# Patient Record
Sex: Female | Born: 1970 | Race: White | Hispanic: No | Marital: Married | Smoking: Never smoker
Health system: Southern US, Community
[De-identification: ages and names within clinical notes are randomized; demographics above are authoritative.]

## PROBLEM LIST (undated history)

## (undated) DIAGNOSIS — E079 Disorder of thyroid, unspecified: Secondary | ICD-10-CM

## (undated) DIAGNOSIS — N83209 Unspecified ovarian cyst, unspecified side: Secondary | ICD-10-CM

## (undated) DIAGNOSIS — F419 Anxiety disorder, unspecified: Secondary | ICD-10-CM

## (undated) HISTORY — DX: Anxiety disorder, unspecified: F41.9

## (undated) HISTORY — PX: INTRAUTERINE DEVICE INSERTION: SHX323

## (undated) HISTORY — DX: Unspecified ovarian cyst, unspecified side: N83.209

## (undated) HISTORY — PX: BREAST BIOPSY: SHX20

## (undated) HISTORY — PX: NASAL SEPTUM SURGERY: SHX37

## (undated) HISTORY — DX: Disorder of thyroid, unspecified: E07.9

---

## 2001-02-07 ENCOUNTER — Other Ambulatory Visit: Admission: RE | Admit: 2001-02-07 | Discharge: 2001-02-07 | Payer: Self-pay | Admitting: Obstetrics and Gynecology

## 2002-08-14 ENCOUNTER — Ambulatory Visit (HOSPITAL_COMMUNITY): Admission: RE | Admit: 2002-08-14 | Discharge: 2002-08-14 | Payer: Self-pay

## 2002-08-16 ENCOUNTER — Encounter (INDEPENDENT_AMBULATORY_CARE_PROVIDER_SITE_OTHER): Payer: Self-pay | Admitting: Specialist

## 2002-08-16 ENCOUNTER — Ambulatory Visit (HOSPITAL_COMMUNITY): Admission: RE | Admit: 2002-08-16 | Discharge: 2002-08-16 | Payer: Self-pay

## 2002-12-12 ENCOUNTER — Ambulatory Visit (HOSPITAL_COMMUNITY): Admission: RE | Admit: 2002-12-12 | Discharge: 2002-12-12 | Payer: Self-pay

## 2002-12-24 ENCOUNTER — Ambulatory Visit (HOSPITAL_COMMUNITY): Admission: RE | Admit: 2002-12-24 | Discharge: 2002-12-24 | Payer: Self-pay

## 2003-01-16 ENCOUNTER — Ambulatory Visit (HOSPITAL_COMMUNITY): Admission: RE | Admit: 2003-01-16 | Discharge: 2003-01-16 | Payer: Self-pay

## 2003-01-17 ENCOUNTER — Encounter: Admission: RE | Admit: 2003-01-17 | Discharge: 2003-01-17 | Payer: Self-pay | Admitting: *Deleted

## 2003-01-24 ENCOUNTER — Observation Stay (HOSPITAL_COMMUNITY): Admission: RE | Admit: 2003-01-24 | Discharge: 2003-01-24 | Payer: Self-pay

## 2003-07-08 ENCOUNTER — Inpatient Hospital Stay (HOSPITAL_COMMUNITY): Admission: AD | Admit: 2003-07-08 | Discharge: 2003-07-08 | Payer: Self-pay

## 2003-07-25 ENCOUNTER — Ambulatory Visit (HOSPITAL_COMMUNITY): Admission: RE | Admit: 2003-07-25 | Discharge: 2003-07-25 | Payer: Self-pay

## 2003-07-28 ENCOUNTER — Inpatient Hospital Stay (HOSPITAL_COMMUNITY): Admission: RE | Admit: 2003-07-28 | Discharge: 2003-08-01 | Payer: Self-pay

## 2003-11-26 ENCOUNTER — Other Ambulatory Visit: Admission: RE | Admit: 2003-11-26 | Discharge: 2003-11-26 | Payer: Self-pay | Admitting: Obstetrics and Gynecology

## 2004-11-26 ENCOUNTER — Other Ambulatory Visit: Admission: RE | Admit: 2004-11-26 | Discharge: 2004-11-26 | Payer: Self-pay | Admitting: Obstetrics and Gynecology

## 2006-02-21 ENCOUNTER — Other Ambulatory Visit: Admission: RE | Admit: 2006-02-21 | Discharge: 2006-02-21 | Payer: Self-pay | Admitting: Obstetrics and Gynecology

## 2006-06-29 ENCOUNTER — Ambulatory Visit: Payer: Self-pay | Admitting: Family Medicine

## 2006-07-06 ENCOUNTER — Ambulatory Visit: Payer: Self-pay | Admitting: Family Medicine

## 2006-12-27 ENCOUNTER — Ambulatory Visit: Payer: Self-pay | Admitting: Family Medicine

## 2006-12-28 ENCOUNTER — Ambulatory Visit: Payer: Self-pay | Admitting: Family Medicine

## 2006-12-28 LAB — CONVERTED CEMR LAB
Eosinophils Absolute: 0.1 10*3/uL (ref 0.0–0.6)
Eosinophils Relative: 2.2 % (ref 0.0–5.0)
Lymphocytes Relative: 33.9 % (ref 12.0–46.0)
MCV: 93.8 fL (ref 78.0–100.0)
Monocytes Relative: 10.7 % (ref 3.0–11.0)
Neutro Abs: 2.2 10*3/uL (ref 1.4–7.7)
Platelets: 234 10*3/uL (ref 150–400)
T3, Free: 3.1 pg/mL (ref 2.3–4.2)

## 2007-01-16 ENCOUNTER — Ambulatory Visit: Payer: Self-pay | Admitting: Gastroenterology

## 2007-03-01 DIAGNOSIS — E039 Hypothyroidism, unspecified: Secondary | ICD-10-CM

## 2007-03-01 DIAGNOSIS — G43009 Migraine without aura, not intractable, without status migrainosus: Secondary | ICD-10-CM | POA: Insufficient documentation

## 2007-03-01 DIAGNOSIS — J342 Deviated nasal septum: Secondary | ICD-10-CM

## 2007-03-06 ENCOUNTER — Ambulatory Visit: Payer: Self-pay | Admitting: Gastroenterology

## 2007-03-20 ENCOUNTER — Ambulatory Visit: Payer: Self-pay | Admitting: Gastroenterology

## 2007-04-12 ENCOUNTER — Ambulatory Visit: Payer: Self-pay | Admitting: Family Medicine

## 2007-04-12 ENCOUNTER — Encounter: Payer: Self-pay | Admitting: Family Medicine

## 2007-04-12 ENCOUNTER — Encounter (INDEPENDENT_AMBULATORY_CARE_PROVIDER_SITE_OTHER): Payer: Self-pay | Admitting: Family Medicine

## 2007-04-13 LAB — CONVERTED CEMR LAB
Free T4: 0.9 ng/dL
T3, Free: 2.9 pg/mL
TSH: 2.69 u[IU]/mL

## 2007-04-16 ENCOUNTER — Telehealth (INDEPENDENT_AMBULATORY_CARE_PROVIDER_SITE_OTHER): Payer: Self-pay | Admitting: *Deleted

## 2007-05-29 ENCOUNTER — Ambulatory Visit: Payer: Self-pay | Admitting: Family Medicine

## 2007-05-30 ENCOUNTER — Telehealth (INDEPENDENT_AMBULATORY_CARE_PROVIDER_SITE_OTHER): Payer: Self-pay | Admitting: *Deleted

## 2007-07-12 ENCOUNTER — Encounter (INDEPENDENT_AMBULATORY_CARE_PROVIDER_SITE_OTHER): Payer: Self-pay | Admitting: Family Medicine

## 2007-07-12 ENCOUNTER — Ambulatory Visit: Payer: Self-pay | Admitting: Family Medicine

## 2007-07-12 ENCOUNTER — Other Ambulatory Visit: Admission: RE | Admit: 2007-07-12 | Discharge: 2007-07-12 | Payer: Self-pay | Admitting: Family Medicine

## 2007-07-17 ENCOUNTER — Telehealth (INDEPENDENT_AMBULATORY_CARE_PROVIDER_SITE_OTHER): Payer: Self-pay | Admitting: *Deleted

## 2007-07-17 LAB — CONVERTED CEMR LAB
Folate: 17.2 ng/mL
TSH: 2.35 microintl units/mL (ref 0.35–5.50)

## 2007-07-26 ENCOUNTER — Encounter: Admission: RE | Admit: 2007-07-26 | Discharge: 2007-07-26 | Payer: Self-pay | Admitting: Family Medicine

## 2007-07-31 ENCOUNTER — Encounter: Admission: RE | Admit: 2007-07-31 | Discharge: 2007-07-31 | Payer: Self-pay | Admitting: Family Medicine

## 2007-08-01 ENCOUNTER — Encounter (INDEPENDENT_AMBULATORY_CARE_PROVIDER_SITE_OTHER): Payer: Self-pay | Admitting: Diagnostic Radiology

## 2007-08-01 ENCOUNTER — Encounter (INDEPENDENT_AMBULATORY_CARE_PROVIDER_SITE_OTHER): Payer: Self-pay | Admitting: Family Medicine

## 2007-08-01 ENCOUNTER — Encounter: Admission: RE | Admit: 2007-08-01 | Discharge: 2007-08-01 | Payer: Self-pay | Admitting: Family Medicine

## 2007-09-12 ENCOUNTER — Ambulatory Visit: Payer: Self-pay | Admitting: Family Medicine

## 2007-09-13 ENCOUNTER — Telehealth (INDEPENDENT_AMBULATORY_CARE_PROVIDER_SITE_OTHER): Payer: Self-pay | Admitting: *Deleted

## 2007-11-16 ENCOUNTER — Telehealth (INDEPENDENT_AMBULATORY_CARE_PROVIDER_SITE_OTHER): Payer: Self-pay | Admitting: *Deleted

## 2008-01-29 ENCOUNTER — Encounter: Admission: RE | Admit: 2008-01-29 | Discharge: 2008-01-29 | Payer: Self-pay | Admitting: Family Medicine

## 2008-03-14 ENCOUNTER — Telehealth (INDEPENDENT_AMBULATORY_CARE_PROVIDER_SITE_OTHER): Payer: Self-pay | Admitting: *Deleted

## 2008-03-24 ENCOUNTER — Telehealth (INDEPENDENT_AMBULATORY_CARE_PROVIDER_SITE_OTHER): Payer: Self-pay | Admitting: *Deleted

## 2008-07-28 ENCOUNTER — Encounter: Admission: RE | Admit: 2008-07-28 | Discharge: 2008-07-28 | Payer: Self-pay | Admitting: Obstetrics and Gynecology

## 2008-12-29 ENCOUNTER — Ambulatory Visit: Payer: Self-pay | Admitting: Family Medicine

## 2008-12-29 DIAGNOSIS — M549 Dorsalgia, unspecified: Secondary | ICD-10-CM | POA: Insufficient documentation

## 2008-12-29 DIAGNOSIS — R5383 Other fatigue: Secondary | ICD-10-CM

## 2008-12-29 DIAGNOSIS — R5381 Other malaise: Secondary | ICD-10-CM

## 2009-01-05 ENCOUNTER — Telehealth (INDEPENDENT_AMBULATORY_CARE_PROVIDER_SITE_OTHER): Payer: Self-pay | Admitting: *Deleted

## 2009-01-05 LAB — CONVERTED CEMR LAB
Basophils Absolute: 0 10*3/uL (ref 0.0–0.1)
Basophils Relative: 0.9 % (ref 0.0–3.0)
Folate: 12.8 ng/mL
Free T4: 1.3 ng/dL (ref 0.6–1.6)
GFR calc Af Amer: 91 mL/min
GFR calc non Af Amer: 75 mL/min
Lymphocytes Relative: 31 % (ref 12.0–46.0)
MCHC: 33.8 g/dL (ref 30.0–36.0)
Neutrophils Relative %: 57.3 % (ref 43.0–77.0)
Platelets: 222 10*3/uL (ref 150–400)
Potassium: 4.2 meq/L (ref 3.5–5.1)
RBC: 4.42 M/uL (ref 3.87–5.11)
RDW: 12.1 % (ref 11.5–14.6)
Sodium: 138 meq/L (ref 135–145)
T3, Free: 3.1 pg/mL (ref 2.3–4.2)
TSH: 1.38 microintl units/mL (ref 0.35–5.50)
Vitamin B-12: 327 pg/mL (ref 211–911)

## 2009-06-11 ENCOUNTER — Other Ambulatory Visit: Admission: RE | Admit: 2009-06-11 | Discharge: 2009-06-11 | Payer: Self-pay | Admitting: Obstetrics and Gynecology

## 2009-10-30 ENCOUNTER — Telehealth: Payer: Self-pay | Admitting: Family Medicine

## 2009-11-25 ENCOUNTER — Telehealth: Payer: Self-pay | Admitting: Family Medicine

## 2009-12-07 ENCOUNTER — Ambulatory Visit: Payer: Self-pay | Admitting: Family Medicine

## 2009-12-07 DIAGNOSIS — R7309 Other abnormal glucose: Secondary | ICD-10-CM

## 2009-12-08 LAB — CONVERTED CEMR LAB
AST: 25 units/L (ref 0–37)
Albumin: 4.8 g/dL (ref 3.5–5.2)
Alkaline Phosphatase: 51 units/L (ref 39–117)
Bilirubin, Direct: 0 mg/dL (ref 0.0–0.3)
Calcium: 8.9 mg/dL (ref 8.4–10.5)
Cholesterol: 154 mg/dL (ref 0–200)
Creatinine, Ser: 0.6 mg/dL (ref 0.4–1.2)
Free T4: 1 ng/dL (ref 0.6–1.6)
Total Protein: 7.9 g/dL (ref 6.0–8.3)
Triglycerides: 62 mg/dL (ref 0.0–149.0)

## 2009-12-11 ENCOUNTER — Telehealth (INDEPENDENT_AMBULATORY_CARE_PROVIDER_SITE_OTHER): Payer: Self-pay | Admitting: *Deleted

## 2009-12-14 ENCOUNTER — Telehealth (INDEPENDENT_AMBULATORY_CARE_PROVIDER_SITE_OTHER): Payer: Self-pay | Admitting: *Deleted

## 2009-12-24 ENCOUNTER — Telehealth (INDEPENDENT_AMBULATORY_CARE_PROVIDER_SITE_OTHER): Payer: Self-pay | Admitting: *Deleted

## 2010-06-21 ENCOUNTER — Other Ambulatory Visit: Admission: RE | Admit: 2010-06-21 | Discharge: 2010-06-21 | Payer: Self-pay | Admitting: Obstetrics and Gynecology

## 2010-10-26 ENCOUNTER — Ambulatory Visit: Payer: Self-pay | Admitting: Family Medicine

## 2010-12-14 NOTE — Progress Notes (Signed)
Summary: refill  Phone Note Refill Request Message from:  Fax from Pharmacy on [address] fax [phone]  Refills Requested: Medication #1:  SYNTHROID 50 MCG  TABS 1 by mouth once daily. NEEDS LABWORK. Initial call taken by: Barb Merino,  December 14, 2009 2:30 PM    New/Updated Medications: SYNTHROID 50 MCG  TABS (LEVOTHYROXINE SODIUM) 1 by mouth once daily. Prescriptions: SYNTHROID 50 MCG  TABS (LEVOTHYROXINE SODIUM) 1 by mouth once daily.  #30 x 2   Entered by:   Army Fossa CMA   Authorized by:   Loreen Freud DO   Signed by:   Army Fossa CMA on 12/14/2009   Method used:   [facility]        [facility] [phone]* (retail)       [address]       [place], [place]  [address]       Ph: [phone]       Fax: [phone]   RxID:   [id number]

## 2010-12-14 NOTE — Assessment & Plan Note (Signed)
Summary: ROV/labs/drb   Vital Signs:  Patient profile:   40 year old female Height:      66.5 inches Weight:      163 pounds BMI:     26.01 Temp:     97.8 degrees F oral Pulse rate:   70 / minute Pulse rhythm:   regular BP sitting:   102 / 80  (left arm) Cuff size:   regular  Vitals Entered By: Army Fossa CMA (December 07, 2009 9:37 AM) CC: ROV, labs for thyroid.    History of Present Illness: Pt here for thyroid check.  Pt only complaints is she is always cold.   Pt still c/o back pain around menstrual cycle--- like a muscle spasm.  It resolves with end of cycle and she gets no relief with IB or aleve.  Current Medications (verified): 1)  Synthroid 50 Mcg  Tabs (Levothyroxine Sodium) .Marland Kitchen.. 1 By Mouth Once Daily. Needs Labwork. 2)  Camila 0.35 Mg Tabs (Norethindrone (Contraceptive)) .... As Directed. 3)  Flonase 50 Mcg/act Susp (Fluticasone Propionate) 4)  Skelaxin 800 Mg Tabs (Metaxalone) .Marland Kitchen.. 1 By Mouth Qid As Needed  Allergies (verified): No Known Drug Allergies  Past History:  Past medical, surgical, family and social histories (including risk factors) reviewed for relevance to current acute and chronic problems.  Past Medical History: Reviewed history from 12/29/2008 and no changes required. Hypothyroidism  Past Surgical History: Reviewed history from 07/12/2007 and no changes required. c/s deviated septum  Family History: Reviewed history and no changes required. MGM-- breast CA Family History Hypertension Family History High cholesterol MGF--DM II  Social History: Reviewed history from 07/12/2007 and no changes required. Married Never Smoked Alcohol use-yes Drug use-no Regular exercise-yes Occupation: Massage therapy  Review of Systems      See HPI  Physical Exam  General:  Well-developed,well-nourished,in no acute distress; alert,appropriate and cooperative throughout examination Neck:  No deformities, masses, or tenderness noted. Lungs:   Normal respiratory effort, chest expands symmetrically. Lungs are clear to auscultation, no crackles or wheezes. Heart:  normal rate and no murmur.   Psych:  Cognition and judgment appear intact. Alert and cooperative with normal attention span and concentration. No apparent delusions, illusions, hallucinations   Impression & Recommendations:  Problem # 1:  HYPOTHYROIDISM (ICD-244.9)  Her updated medication list for this problem includes:    Synthroid 50 Mcg Tabs (Levothyroxine sodium) .Marland Kitchen... 1 by mouth once daily. needs labwork.  Labs Reviewed: TSH: 1.38 (12/29/2008)     Orders: Venipuncture (16109) TLB-BMP (Basic Metabolic Panel-BMET) (80048-METABOL) TLB-Lipid Panel (80061-LIPID) TLB-Hepatic/Liver Function Pnl (80076-HEPATIC) TLB-TSH (Thyroid Stimulating Hormone) (84443-TSH) TLB-A1C / Hgb A1C (Glycohemoglobin) (83036-A1C) TLB-T3, Free (Triiodothyronine) (84481-T3FREE) TLB-T4 (Thyrox), Free [phone])  Problem # 2:  BACK PAIN (ICD-724.5)  Pt states the pain seems to be around period and its  muscle spasms--that resolve when cycle done---aleve and IB give no relief Her updated medication list for this problem includes:    Skelaxin 800 Mg Tabs (Metaxalone) .Marland Kitchen... 1 by mouth qid as needed  Discussed use of moist heat or ice, modified activities, medications, and stretching/strengthening exercises. Back care instructions given. To be seen in 2 weeks if no improvement; sooner if worsening of symptoms.   Problem # 3:  HYPERGLYCEMIA (ICD-790.29) last labs not fasting ---repeat today Orders: Venipuncture (19147) TLB-BMP (Basic Metabolic Panel-BMET) (80048-METABOL) TLB-Lipid Panel (80061-LIPID) TLB-Hepatic/Liver Function Pnl (80076-HEPATIC) TLB-TSH (Thyroid Stimulating Hormone) (84443-TSH) TLB-A1C / Hgb A1C (Glycohemoglobin) (83036-A1C) TLB-T3, Free (Triiodothyronine) (84481-T3FREE) TLB-T4 (Thyrox), Free [phone])  Complete Medication List:  1)  Synthroid 50 Mcg Tabs  (Levothyroxine sodium) .Marland Kitchen.. 1 by mouth once daily. needs labwork. 2)  Camila 0.35 Mg Tabs (Norethindrone (contraceptive)) .... As directed. 3)  Flonase 50 Mcg/act Susp (Fluticasone propionate) 4)  Skelaxin 800 Mg Tabs (Metaxalone) .Marland Kitchen.. 1 by mouth qid as needed Prescriptions: SKELAXIN 800 MG TABS (METAXALONE) 1 by mouth qid as needed  #30 x 0   Entered and Authorized by:   Loreen Freud DO   Signed by:   Loreen Freud DO on 12/07/2009   Method used:   Electronically to        [facility] [phone]* (retail)       [address]       [place], [place]  [address]       Ph: [phone]       Fax: [phone]   RxID:   [id number]

## 2010-12-14 NOTE — Progress Notes (Signed)
Summary: Labs   Phone Note Call from Patient   Summary of Call: Pt called and stated she needs labs done- she would like a ANA, Vitamin b and her TSH- is there anything else you would like checked?  Initial call taken by: Army Fossa CMA,  November 25, 2009 9:49 AM  Follow-up for Phone Call        She was seen last Feb---she is also due for ov--- I can't just order ANA, b12 without Dx codes--- maybe she should come in to discuss it---come in fasting---then I can send her to the lab when we are done.   Follow-up by: Loreen Freud DO,  November 25, 2009 10:00 AM  Additional Follow-up for Phone Call Additional follow up Details #1::        appt made. Army Fossa CMA  November 25, 2009 10:08 AM

## 2010-12-14 NOTE — Progress Notes (Signed)
Summary: REFILL  Phone Note Refill Request Message from:  [facility] from [facility] on [facility] FAX [phone]  Refills Requested: Medication #1:  SYNTHROID 50 MCG  TABS 1 by mouth once daily. Initial call taken by: Barb Merino,  December 24, 2009 8:23 AM  Follow-up for Phone Call        spoke with pharmacy- she has refills on file. Army Fossa CMA  December 24, 2009 10:01 AM

## 2010-12-14 NOTE — Progress Notes (Signed)
Summary: lab results  Phone Note Call from Patient   Caller: Patient Summary of Call: pt left VM on danielle phone stating that she would like the results of her labs. called pt back informed pt labs normal and letter mailed...............Marland KitchenFelecia Deloach CMA  December 11, 2009 10:26 AM

## 2011-02-09 ENCOUNTER — Telehealth: Payer: Self-pay | Admitting: Family Medicine

## 2011-02-09 MED ORDER — LEVOTHYROXINE SODIUM 50 MCG PO TABS: 50.0000 ug | ORAL_TABLET | Freq: Every day | ORAL | Status: DC

## 2011-02-09 NOTE — Telephone Encounter (Signed)
Pt is requesting refill for Synthroid.[facility].

## 2011-03-02 ENCOUNTER — Other Ambulatory Visit: Payer: Self-pay | Admitting: Obstetrics and Gynecology

## 2011-03-02 DIAGNOSIS — Z1231 Encounter for screening mammogram for malignant neoplasm of breast: Secondary | ICD-10-CM

## 2011-03-28 ENCOUNTER — Telehealth: Payer: Self-pay | Admitting: Family Medicine

## 2011-03-28 NOTE — Telephone Encounter (Signed)
No documentation from [facility]----Mssg left for patient to call back      KP

## 2011-03-28 NOTE — Telephone Encounter (Signed)
Patient called because it is time for her yearly refill for her Synthroid 16 medication---says she had CPX in January at [facility] [address]

## 2011-03-29 MED ORDER — LEVOTHYROXINE SODIUM 50 MCG PO TABS: 50.0000 ug | ORAL_TABLET | Freq: Every day | ORAL | Status: DC

## 2011-03-29 NOTE — Telephone Encounter (Signed)
Pt return call will bring in labs and OV correspondence performed at [facility] for Dr Laury Axon to review. Pt notes that she has had a lot of test done this year and would like to avoid any further blood work if she can.

## 2011-03-29 NOTE — Telephone Encounter (Signed)
Rx faxed----results pending     KP

## 2011-03-30 ENCOUNTER — Other Ambulatory Visit: Payer: Self-pay | Admitting: Family Medicine

## 2011-03-30 NOTE — Telephone Encounter (Signed)
Patient has appt for CPX on 04/26/2011 at 2:00--she needs refill for Synthroid 50 now that she has appt--  I see refill in computer, but phone note states:   Rx faxed----results pending KP (entered 5/15)---does that mean it was sent to pharmacy????

## 2011-03-31 NOTE — Telephone Encounter (Signed)
Yes it was faxed on 03/29/11.

## 2011-04-01 MED ORDER — LEVOTHYROXINE SODIUM 50 MCG PO TABS: 50.0000 ug | ORAL_TABLET | Freq: Every day | ORAL | Status: DC

## 2011-04-01 NOTE — Assessment & Plan Note (Signed)
[facility]                        [place] OFFICE NOTE   ZHOEY, BLACKSTOCK                      MRN:          [id number]  DATE:12/27/2006                            DOB:          05/22/1971    REASON FOR VISIT:  Rectal bleeding 1 week ago and stress.  Ms. Camera is  a 40 year old female who reports that over a week ago she came down with  a stomach virus.  It was associated with upset stomach and diarrhea.  Symptoms had improved, but 1 day prior to having rectal bleeding, she  noted that her stool was dark.  The subsequent day, she noted blood in  the commode.  It only occurred once and she has not had a recurrence in  1 week.  It was painless.  The patient states that she has never had  this happen before.  Of note, the patient does state that her  grandfather had a history of colon cancer, and unfortunately she does  not know mother's status because mother refuses to have a colonoscopy.  She reports that her father has a history of colon polyps and gets  surveillance yearly.   Ms. Kuroda also reports that she has been under a lot of stress.  She  states that she worries a lot and feels anxious.  It lasts for a couple  days and then resolves.  It never stays for an extended period of time.  Never the less, it is very frustrating for the patient.  As per her own  words, she has no time for this.  She denies any homicidal or suicidal  ideations.  She denies any depression.   PAST MEDICAL HISTORY:  1. History of hypothyroidism.  2. Migraines.  3. Basal cell carcinoma.   MEDICATIONS:  Over-the-counter herbs.   ALLERGIES:  No known drug allergies.   OBJECTIVE:  Weight 156.4, temperature 98.1, pulse 72, blood pressure  128/80.  GENERAL:  We have a pleasant female in no acute distress.  Answers  questions appropriately.  ABDOMEN:  Unremarkable.  RECTAL:  Significant for no obvious external hemorrhoids.  Normal tone.  No palpable  masses.  Negative Hemoccult.  Anoscope significant for no  obvious lesions.  No hemorrhoids noted.  PSYCH:  Significant for normal mood.  Speech regular rate and rhythm.  The patient is well-dressed and groomed, answers questions  appropriately.   IMPRESSION:  1. One episode of rectal bleeding in the presence of an acute      enteritis.  Never the less, given the family history of colon      cancer and colon polyps, further evaluation is warranted.  2. Acute stress reaction.   PLAN:  1. I will obtain a CBC to rule out anemia.  Subsequently, will refer      the patient to      gastroenterology for further recommendations.  2. In regard to the acute stress reaction, will refer to Berniece Andreas      for further therapy.  The patient expressed understanding.     Leanne Chang, M.D.  Electronically Signed  LA/MedQ  DD: 12/27/2006  DT: 12/27/2006  Job #: [id number]

## 2011-04-01 NOTE — Op Note (Signed)
   NAME:  Gina Pineda, Gina Pineda                         ACCOUNT NO.:  [id number]   MEDICAL RECORD NO.:  [id number]                   PATIENT TYPE:  MAT   LOCATION:  MATC                                 [facility]ILITY[facility]  [facility]   PHYSICIAN:  Ronda Fairly. Galen Daft, M.D.              DATE OF BIRTH:  September 16, 1971   DATE OF PROCEDURE:  07/08/2003  DATE OF DISCHARGE:                                 OPERATIVE REPORT   PREOPERATIVE DIAGNOSIS:  Cerclage removal.   POSTOPERATIVE DIAGNOSIS:  Cerclage removal.   PROCEDURE:  Cerclage removal.   ANESTHESIA:  None.   COMPLICATIONS:  None.   ESTIMATED BLOOD LOSS:  Less than 5 mL.   SURGEON:  Ronda Fairly. Galen Daft, M.D.   The patient was brought to the maternity admissions unit for a cerclage  removal.  We obtained informed verbal consent.  The cerclage was removed  without difficulty.  It was a singe suture which was grasped up near the  knot and underneath the knot was cut with a long scissors.  It was removed  without difficulty.  There was no active bleeding afterward.  There was  approximately 5 mL of blood total and this stopped without any difficult.  The group B strep culture was obtained prior to the patient leaving.  Sterile technique was utilized and sterile gloves.  The cervix was 1 cm  dilated at the termination of the procedure.  Otherwise unremarkable.  Membranes remained intact.                                                Ronda Fairly. Galen Daft, M.D.    NJT/MEDQ  D:  07/08/2003  T:  07/08/2003  Job:  [id number]

## 2011-04-01 NOTE — Op Note (Signed)
NAME:  [name], [name]                         ACCOUNT NO.:  [id number]   MEDICAL RECORD NO.:  [id number]                   PATIENT TYPE:  INP   LOCATION:  9158                                 [facility]   PHYSICIAN:  Ronda Fairly. Galen Daft, M.D.              DATE OF BIRTH:  06-08-1971   DATE OF PROCEDURE:  07/28/2003  DATE OF DISCHARGE:                                 OPERATIVE REPORT   PREOPERATIVE DIAGNOSIS:  Breech presentation.   POSTOPERATIVE DIAGNOSIS:  Breech presentation.   PROCEDURE:  External cephalic version, unsuccessful.   SURGEON:  Ronda Fairly. Galen Daft, M.D.   ANESTHESIA:  None.   COMPLICATIONS:  None.   ESTIMATED BLOOD LOSS:  None.   PROCEDURE NOTE:  The patient was identified as Gina Pineda.  We went over  the informed consent of all risks, benefits, and alternatives of the  procedure.  The discomfort of the procedure was explicitly explained and the  patient agreed to the procedure.  The plan was for external cephalic  version, followed by induction of labor.  She is 38 weeks, 4 days.  If this  was not successful, the back-up plan was for cesarean delivery that day.  This was to reduce the risk of cord accident, and it was felt that she was  within a margin of error of 39 weeks' gestation or elective delivery date.  The patient was offered all other options, including expectant management,  primary cesarean section at 39 weeks, version later on in the week, and  vaginal breech also was explained but refuted by the patient.  The procedure  went as follows:  She received intravenous administration of lactated  Ringer's and dosage of 0.25 mg intravenous terbutaline administered by me.  The baby was confirmed to be in breech presentation with normal fetal heart  tones.  External cephalic version was carried out in a forward fashion after  identification of all anatomy and position, and the head was flexed well and  the placenta was lateral, and the baby was in a  frank breech presentation.  The forward roll was unsuccessful.  It was attempted a second time.  The  patient did very well tolerating it, although it was quite uncomfortable.  A  third chance was offered with the possibility of a backward flip, and the  patient declined this.  Therefore, cesarean section was the plan for back-  up.  The patient tolerated the procedure well.  There was approximately a  one-minute deceleration associated with the procedure, which resolved  spontaneously without the need for any medication or any other measures.  The fetal heart rate was reassuring after the procedure.  Ronda Fairly. Galen Daft, M.D.    NJT/MEDQ  D:  07/28/2003  T:  07/28/2003  Job:  [id number]

## 2011-04-01 NOTE — H&P (Signed)
   NAME:  Gina Pineda, Gina Pineda                         ACCOUNT NO.:  [id number]   MEDICAL RECORD NO.:  [id number]                   PATIENT TYPE:  MAT   LOCATION:  MATC                                 [facility]:  [facility]   PHYSICIAN:  Ronda Fairly. Galen Daft, M.D.              DATE OF BIRTH:  17-Apr-1971   DATE OF ADMISSION:  07/08/2003  DATE OF DISCHARGE:                                HISTORY & PHYSICAL   CHIEF COMPLAINT:  Cervical cerclage, for removal.   HISTORY:  The patient is 36 weeks, G3 P1, who is presenting for a cervical  cerclage removal.  She had a cerclage because of her prior cervical  incompetence problem with her previous delivery; she did deliver at term  ultimately.  The patient had no prenatal problems.  Her cerclage was placed  at around the 12th week of pregnancy and it was a single suture McDonald-  type cerclage.   PROCEDURE:  The patient was placed in a modified dorsal lithotomy position.  Speculum was utilized - sterile speculum and sterile gloves.  The stitch was  grasped and removed without difficulty.  Group B strep culture was obtained.  The procedure was uncomplicated.   PHYSICAL EXAMINATION:  GENERAL:  The patient was alert and oriented.  VITAL SIGNS:  Stable.  The fetus was reactive and reassuring.  ABDOMEN:  The upper abdomen unremarkable, gravid, vertex presentation.  CARDIAC:  Regular rate and rhythm.  RESPIRATORY:  Clear.  The remainder of examination unremarkable.  PELVIC:  Shows the suture in place, which was removed as described above.   ASSESSMENT:  The patient here for suture removal, which was completed.   PLAN:  Discharge home with instructions to follow-up for labor precautions;  otherwise, follow up later this week for routine obstetrical care.                                               Ronda Fairly. Galen Daft, M.D.    NJT/MEDQ  D:  07/08/2003  T:  07/08/2003  Job:  [id number]

## 2011-04-01 NOTE — Discharge Summary (Signed)
   NAME:  Gina Pineda, Gina Pineda                         ACCOUNT NO.:  [id number]   MEDICAL RECORD NO.:  [id number]                   PATIENT TYPE:  INP   LOCATION:  9131                                 [facility]   PHYSICIAN:  Charles A. Sydnee Cabal, MD            DATE OF BIRTH:  [birth year]   DATE OF ADMISSION:  07/28/2003  DATE OF DISCHARGE:  08/01/2003                                 DISCHARGE SUMMARY   PRIMARY DISCHARGE DIAGNOSES:  1. Intrauterine pregnancy at term.  2. Breech.   PROCEDURES:  1. Failed external version.  2. Primary low-transverse cesarean section.   DISPOSITION:  1. The patient was discharged home to follow up in the office in 7-10 days.  2. She was given a prescription for Percocet #40, 1-2 p.o. q.4h. p.r.n., 5     mg/325 mg dose.  3. She was given convalescent instructions to rest at home.  Notify of any     increased pain, erythema or drainage about the incision, heavy vaginal     bleeding, temperature greater than 101 degrees.  Convalescent     instructions were given additionally for pelvic rest and for abstaining     from driving a car for 10 days.   LABORATORY:  Hemoglobin was 9.2, on 07/30/2003.   HOSPITAL COURSE:  The patient was admitted, underwent version attempt,  version failed and the patient underwent primary low-transverse cesarean  section.  The patient had a vigorous baby girl.  Estimated blood loss at the  time of surgery was 1,200 cc.  Postoperatively the patient had normal  lochia.  No heavy bleeding.  Hemoglobin returned as noted above.  The  patient voided without difficulty after Foley catheter was discontinued.  Post-op day #1, she was advanced to a general diet.  Post-op day #2,  spontaneous return of flatus.  She continued to do well, had no febrile  morbidity and was discharged home, on post-op day #4, with followup as noted  above.                                               Charles A. Sydnee Cabal, MD    CAD/MEDQ  D:   08/19/2003  T:  08/20/2003  Job:  [id number]

## 2011-04-01 NOTE — Op Note (Signed)
   NAME:  Gina Pineda, Gina Pineda                         ACCOUNT NO.:  [id number]   MEDICAL RECORD NO.:  [id number]                   PATIENT TYPE:  AMB   LOCATION:  SDC                                  [facility]  [facility]   PHYSICIAN:  Ronda Fairly. Galen Daft, M.D.              DATE OF BIRTH:  04/20/1971   DATE OF PROCEDURE:  08/16/2002  DATE OF DISCHARGE:  08/16/2002                                 OPERATIVE REPORT   PREOPERATIVE DIAGNOSIS:  Missed abortion.   POSTOPERATIVE DIAGNOSIS:  Missed abortion.   PROCEDURE:  Suction, dilatation and evacuation.   SURGEON:  Ronda Fairly. Galen Daft, M.D.   ANESTHESIA:  IV sedation and local anesthesia.   COMPLICATIONS:  None.   ESTIMATED BLOOD LOSS:  Less than 5 cc.   SPECIMENS:  Uterine contents.   ESTIMATED BLOOD LOSS:  Less than 5 cc.   DESCRIPTION OF PROCEDURE:  The patient was identified.  We obtained informed  consent prior to bringing her to the operating room.  She had documented intrauterine fetal demise at eight weeks' gestation.  The  patient had been apprised of the risks of the procedure, the risks of an  incomplete procedure and the risks of internal injury, infection, bleeding  and injury.  The procedure was carried out as follows.  The Betadine prep  was utilized.  The patient had some intravenous sedation during this and she  also had her bladder catheterized and emptied.  The cervix was grasped with  a single-tooth tenaculum and 10 cc of 1% lidocaine was utilized for  injection for cervical block.  The cervix was dilated enough to accept a 10  mm curved suction curet.  This was placed into the fundus.  The uterus was  noted to be retroverted on bimanual examination.  The products of conception  were removed without difficulty.  A sharp curet was utilized in all of the  quadrants, and there was no evidence of retained tissue.  The suction curet  was placed one final attempt, and there was no additional tissue obtained.  The patient tolerated  the procedure well.  There were no operative  complication.  She left the operating room in stable condition.  She was  given RhoGAM in the postoperative recovery room as ordered and discharged  home the same day.  All instrument, sponge, and needle counts were correct  throughout the case.                                               Ronda Fairly. Galen Daft, M.D.   NJT/MEDQ  D:  08/16/2002  T:  08/18/2002  Job:  [id number]   cc:   Lilyan Punt. Sydnee Levans, M.D.

## 2011-04-01 NOTE — Assessment & Plan Note (Signed)
[facility]                         GASTROENTEROLOGY OFFICE NOTE   SAINA, WAAGE                      MRN:          [id number]  DATE:01/16/2007                            DOB:          1971-08-20    REASON FOR REFERAL:  Dr. Blossom Hoops asked me to evaluate Gina Pineda in  consultation regarding minor rectal bleeding.   HISTORY OF PRESENT ILLNESS:  Gina Pineda is a very pleasant 40 year old  woman who had a single episode of bright red blood per rectum  approximately 2 to 3 weeks ago. About a week before that she had a what  sounds like a viral gastroenteritis that swept through her household  over the course of 24 to 48 hours. She had never had bleeding like this  before, it seemed very self limited. She presented to her primary care  physician who did anoscopy, did not find any hemorrhoids. He did CBC  which was normal. She has not seen bleeding since then. She never has  trouble with constipation or diarrhea.   REVIEW OF SYSTEMS:  Notable for stable weight and is otherwise  essentially normal and is available on her nursing intake sheet.   PAST MEDICAL HISTORY:  Hypothyroid 2007. Autoimmune Raynaud's,  Hashimoto's thyroiditis.   CURRENT MEDICATIONS:  Syllium, iodine, Astragalus.   ALLERGIES:  No known drug allergies.   SOCIAL HISTORY:  Married with 2 children, works as a Teacher, adult education,  nonsmoker, drinks alcohol once weekly or so.   FAMILY HISTORY:  Grandfather with colon cancer, father with colon  polyps.   PHYSICAL EXAMINATION:  She is 5 feet 7 inches, blood pressure 122/64,  pulse 78.  CONSTITUTIONALLY: Generally well-appearing, neurological alert and  oriented x3. Eyes, extraocular movements intact. Mouth, oropharynx  moist, no lesions.  NECK: Supple, no lymphadenopathy.  CARDIOVASCULAR: HEART: Regular rate and rhythm.  LUNGS: Clear to auscultation bilaterally.  ABDOMEN: Soft, nontender, nondistended, normal bowel sounds.  EXTREMITIES: No lower extremity edema.  SKIN: No rashes or lesions on visible extremities.   ASSESSMENT/PLAN:  A 40 year old woman with limited rectal bleeding.   This is likely hemorrhoidal or a small anal fissure. That being said we  should proceed with full colonoscopy to make sure there are no  neoplastic causes. She does have a family history, that with a  grandfather with colon cancer, and her father with colon polyps. We will  therefore arrange  for her to have colonoscopy done at her soonest convenience. I see no  reason for any further blood test or imaging studies prior to then.     Rachael Fee, MD  Electronically Signed    DPJ/MedQ  DD: 01/16/2007  DT: 01/16/2007  Job #: [id number]   cc:   Leanne Chang, M.D.

## 2011-04-01 NOTE — Op Note (Signed)
   NAME:  Gina Pineda, Gina Pineda                         ACCOUNT NO.:  [id number]   MEDICAL RECORD NO.:  [id number]                   PATIENT TYPE:  OBV   LOCATION:  9128                                 FACILITY:  [facility]   PHYSICIAN:  Ronda Fairly. Galen Daft, M.D.              DATE OF BIRTH:  03-29-1971   DATE OF PROCEDURE:  01/24/2003  DATE OF DISCHARGE:  01/24/2003                                 OPERATIVE REPORT   PREOPERATIVE DIAGNOSIS:  Incompetent cervix.   POSTOPERATIVE DIAGNOSIS:  Incompetent cervix.   PROCEDURE:  McDonald cerclage.   SURGEON:  Ronda Fairly. Galen Daft, M.D.   ANESTHESIA:  Spinal.   ESTIMATED BLOOD LOSS:  20 mL.   COMPLICATIONS:  None.   PROCEDURE NOTE:  The patient was identified as [name].  She received  preoperative antibiotic therapy.  She had two prior cone biopsies and  maternal fetal medicine consultation discussed using prophylactic cerclage.  This was performed after informed consent was obtained in the assessment  area.  The patient had Betadine prep, spinal anesthesia, and the bladder was  catheterized and emptied.  The cervix was grasped with the ring forceps, and  using Mersilene tape, the suture was placed from the 5 o'clock position  circumferentially around the cervix as deep as feasible, although there was  a shortened excess cervical length by examination preoperatively and even  postoperatively.  Care was taken to avoid the bladder and the rectum, and  the sutures placed were done without difficulty, and the knot was tied in  the 5 o'clock position after a circumferential suture.  Care was taken to  avoid overtightening or undertightening.  The end was trimmed fairly long,  approximately an inch, and this was hemostatic.  There was no active  bleeding.  All instrument, sponge, and needle counts were correct.  There  was not enough cervical length for a second suture on the outside of the  cervix.  The patient tolerated the procedure well, left the  operating room  in stable condition.                                               Ronda Fairly. Galen Daft, M.D.    NJT/MEDQ  D:  01/24/2003  T:  01/25/2003  Job:  [id number]

## 2011-04-01 NOTE — Op Note (Signed)
NAME:  Gina Pineda, Gina Pineda                         ACCOUNT NO.:  [id number]   MEDICAL RECORD NO.:  [id number]                   PATIENT TYPE:  INP   LOCATION:  9158                                 FACILITY:  [facility]   PHYSICIAN:  Ronda Fairly. Galen Daft, M.D.              DATE OF BIRTH:  [birth year]   DATE OF PROCEDURE:  07/28/2003  DATE OF DISCHARGE:                                 OPERATIVE REPORT   PREOPERATIVE DIAGNOSIS:  Breech presentation.   POSTOPERATIVE DIAGNOSIS:  Breech presentation of term pregnancy, delivered.   PRINCIPAL PROCEDURE:  Primary low transverse cesarean section.   COMPLICATIONS:  None.   ESTIMATED BLOOD LOSS:  1200 mL.   SURGEON:  Ronda Fairly. Galen Daft, M.D.   ANESTHESIA:  Spinal.   FINDINGS:  A female infant with Apgars 8 at one minute and 9 at five minutes  in frank breech presentation.  Weight of 6 pounds 6 ounces and named  Blakely.   DESCRIPTION OF PROCEDURE:  The patient was identified as Gina Pineda.  Time out was performed.  The Betadine prep, sterile technique, and  preoperative antibiotics were given.  The bladder was catheterized at the  beginning of the procedure.  The Pfannenstiel incision was utilized for  abdominal access.  Care was taken to avoid any vital structures.  The  peritoneum was entered without difficulty.  The bladder flap was utilized  and the bladder blade was placed in.  A low cervical transverse uterine  incision was performed.  The baby was delivered in a frank breech  presentation using standard maneuvers.  The head was delivered without any  difficulty and without any traction.  The arms also delivered very readily  through the incision site.  The incision was closed in two layers, first by  running locking followed by an imbricating Lambert's style suture.  There  was complete hemostasis noted.  The ovaries and tubes were unremarkable.  Posterior cul-de-sac unremarkable.  The abdomen was irrigated after  suctioning all blood and  debris.  Again inspection was shown to be a  completely benign, normal-appearing abdomen.  The muscles were  reapproximated at the midline with 1-0 Vicryl followed by fascial closure  with a 1-0 Vicryl in a running suture.  All instrument, sponge, and needle  counts were correct throughout the case.  The skin was irrigated.  The skin  was then closed with 3-0 Monocryl subcuticular.  All sponge, needle, and  instrument counts were correct at the end of the case.  There were no  complications.  The patient tolerated the procedure quite well.                                               Ronda Fairly. Galen Daft, M.D.    NJT/MEDQ  D:  07/28/2003  T:  07/28/2003  Job:  [id number]

## 2011-04-06 ENCOUNTER — Other Ambulatory Visit: Payer: Self-pay

## 2011-04-06 MED ORDER — SYNTHROID 50 MCG PO TABS: 50.0000 ug | ORAL_TABLET | Freq: Every day | ORAL | Status: DC

## 2011-04-06 NOTE — Telephone Encounter (Signed)
Call from patient stating Rx was filled for the Generic when she went to the pharmacy and she was told that she was going to have to pay for it if she filled the BMN. I advised I would call the pharmacy and check status and she stated she already picked up and paid for he RX. I advise I would leave her some samples at check in. She voiced understanding --Rx corrected on the system     KP

## 2011-04-13 ENCOUNTER — Other Ambulatory Visit: Payer: Self-pay | Admitting: Obstetrics and Gynecology

## 2011-04-13 ENCOUNTER — Ambulatory Visit
Admission: RE | Admit: 2011-04-13 | Discharge: 2011-04-13 | Disposition: A | Discharge status: Home / Self Care | Payer: 59 | Source: Ambulatory Visit | Attending: Obstetrics and Gynecology | Admitting: Obstetrics and Gynecology

## 2011-04-13 DIAGNOSIS — Z1231 Encounter for screening mammogram for malignant neoplasm of breast: Secondary | ICD-10-CM

## 2011-04-13 DIAGNOSIS — N6320 Unspecified lump in the left breast, unspecified quadrant: Secondary | ICD-10-CM

## 2011-04-23 ENCOUNTER — Encounter: Payer: Self-pay | Admitting: Family Medicine

## 2011-04-26 ENCOUNTER — Encounter: Payer: Self-pay | Admitting: Family Medicine

## 2011-04-26 ENCOUNTER — Ambulatory Visit (INDEPENDENT_AMBULATORY_CARE_PROVIDER_SITE_OTHER): Payer: 59 | Admitting: Family Medicine

## 2011-04-26 VITALS — BP 116/72 | HR 65 | Temp 98.1°F | Ht 65.5 in | Wt 162.2 lb

## 2011-04-26 DIAGNOSIS — Z0000 Encounter for general adult medical examination without abnormal findings: Secondary | ICD-10-CM

## 2011-04-26 DIAGNOSIS — F439 Reaction to severe stress, unspecified: Secondary | ICD-10-CM

## 2011-04-26 MED ORDER — BUPROPION HCL ER (XL) 300 MG PO TB24: 300.0000 mg | ORAL_TABLET | ORAL | Status: DC

## 2011-04-26 MED ORDER — BUPROPION HCL ER (XL) 150 MG PO TB24: ORAL_TABLET | ORAL | Status: DC

## 2011-04-26 NOTE — Progress Notes (Signed)
Addended by: Arnette Norris on: 04/26/2011 03:35 PM   Modules accepted: Orders

## 2011-04-26 NOTE — Progress Notes (Signed)
  Subjective:     Gina Pineda is a 40 y.o. female and is here for a comprehensive physical exam. The patient reports no problems.  History   Social History  . Marital Status: Married    Spouse Name: N/A    Number of Children: 2  . Years of Education: N/A   Occupational History  . massage therapist    Social History Main Topics  . Smoking status: Never Smoker   . Smokeless tobacco: Never Used  . Alcohol Use: Yes     4-5 drinks/ month  . Drug Use: No  . Sexually Active: Yes -- Female partner(s)   Other Topics Concern  . Not on file   Social History Narrative  . No narrative on file   Health Maintenance  Topic Date Due  . Pap Smear  04/09/1989  . Tetanus/tdap  04/09/1990    The following portions of the patient's history were reviewed and updated as appropriate: allergies, current medications, past family history, past medical history, past social history, past surgical history and problem list.  Review of Systems Review of Systems  Constitutional: Negative for activity change, appetite change and fatigue.  HENT: Negative for hearing loss, congestion, tinnitus and ear discharge.  dentist q58m Eyes: Negative for visual disturbance (see optho q1y -- vision corrected to 20/20 with glasses).  Respiratory: Negative for cough, chest tightness and shortness of breath.   Cardiovascular: Negative for chest pain, palpitations and leg swelling.  Gastrointestinal: Negative for abdominal pain, diarrhea, constipation and abdominal distention.  Genitourinary: Negative for urgency, frequency, decreased urine volume and difficulty urinating.  Musculoskeletal: Negative for back pain, arthralgias and gait problem.  Skin: Negative for color change, pallor and rash.  Neurological: Negative for dizziness, light-headedness, numbness and headaches.  Hematological: Negative for adenopathy. Does not bruise/bleed easily.  Psychiatric/Behavioral: Negative for suicidal ideas, confusion, sleep  disturbance, self-injury, dysphoric mood, decreased concentration and agitation.      Objective:    BP 116/72  Pulse 65  Temp(Src) 98.1 F (36.7 C) (Oral)  Ht 5' 5.5" (1.664 m)  Wt 162 lb 3.2 oz (73.573 kg)  BMI 26.58 kg/m2  SpO2 99% General appearance: alert, cooperative, appears stated age and no distress Head: Normocephalic, without obvious abnormality, atraumatic Eyes: conjunctivae/corneas clear. PERRL, EOM's intact. Fundi benign. Ears: normal TM's and external ear canals both ears Nose: Nares normal. Septum midline. Mucosa normal. No drainage or sinus tenderness. Throat: lips, mucosa, and tongue normal; teeth and gums normal Neck: no adenopathy, no carotid bruit, no JVD, supple, symmetrical, trachea midline and thyroid not enlarged, symmetric, no tenderness/mass/nodules Lungs: clear to auscultation bilaterally Breasts: normal appearance, no masses or tenderness Heart: regular rate and rhythm, S1, S2 normal, no murmur, click, rub or gallop Abdomen: soft, non-tender; bowel sounds normal; no masses,  no organomegaly Pelvic: pap deferred Extremities: extremities normal, atraumatic, no cyanosis or edema Pulses: 2+ and symmetric Skin: Skin color, texture, turgor normal. No rashes or lesions Lymph nodes: Cervical, supraclavicular, and axillary nodes normal. Neurologic: Grossly normal psych:    Assessment:    Healthy female exam.    hypothyroid Plan:    check labs ghm utd  See After Visit Summary for Counseling Recommendations

## 2011-04-26 NOTE — Patient Instructions (Signed)

## 2011-05-30 ENCOUNTER — Other Ambulatory Visit: Payer: Self-pay | Admitting: Family Medicine

## 2011-05-31 ENCOUNTER — Other Ambulatory Visit: Payer: Self-pay

## 2011-06-01 ENCOUNTER — Other Ambulatory Visit (INDEPENDENT_AMBULATORY_CARE_PROVIDER_SITE_OTHER): Payer: 59

## 2011-06-01 DIAGNOSIS — Z0000 Encounter for general adult medical examination without abnormal findings: Secondary | ICD-10-CM

## 2011-06-01 LAB — CBC WITH DIFFERENTIAL/PLATELET
Basophils Relative: 0.7 % (ref 0.0–3.0)
Eosinophils Absolute: 0.1 10*3/uL (ref 0.0–0.7)
Eosinophils Relative: 2 % (ref 0.0–5.0)
Hemoglobin: 15.1 g/dL — ABNORMAL HIGH (ref 12.0–15.0)
Lymphocytes Relative: 27.4 % (ref 12.0–46.0)
MCHC: 34.3 g/dL (ref 30.0–36.0)
Neutro Abs: 2.5 10*3/uL (ref 1.4–7.7)
RBC: 4.65 Mil/uL (ref 3.87–5.11)
WBC: 4.3 10*3/uL — ABNORMAL LOW (ref 4.5–10.5)

## 2011-06-01 LAB — LIPID PANEL
Cholesterol: 131 mg/dL (ref 0–200)
HDL: 56.8 mg/dL (ref >39.00)
LDL Cholesterol: 67 mg/dL (ref 0–99)
Total CHOL/HDL Ratio: 2
Triglycerides: 35 mg/dL (ref 0.0–149.0)
VLDL: 7 mg/dL (ref 0.0–40.0)

## 2011-06-01 LAB — BASIC METABOLIC PANEL
CO2: 25 mEq/L (ref 19–32)
Calcium: 8.9 mg/dL (ref 8.4–10.5)
Chloride: 103 mEq/L (ref 96–112)
Sodium: 138 mEq/L (ref 135–145)

## 2011-06-01 LAB — HEPATIC FUNCTION PANEL
AST: 18 U/L (ref 0–37)
Albumin: 4.9 g/dL (ref 3.5–5.2)
Total Protein: 8.1 g/dL (ref 6.0–8.3)

## 2011-06-01 NOTE — Progress Notes (Signed)
Labs only

## 2011-06-02 ENCOUNTER — Encounter: Payer: Self-pay | Admitting: *Deleted

## 2011-06-06 ENCOUNTER — Other Ambulatory Visit: Payer: Self-pay | Admitting: Family Medicine

## 2011-08-10 ENCOUNTER — Encounter: Payer: Self-pay | Admitting: Family Medicine

## 2011-08-11 ENCOUNTER — Ambulatory Visit: Payer: 59 | Admitting: Family Medicine

## 2011-08-12 ENCOUNTER — Encounter: Payer: Self-pay | Admitting: Family Medicine

## 2011-08-12 ENCOUNTER — Ambulatory Visit (INDEPENDENT_AMBULATORY_CARE_PROVIDER_SITE_OTHER): Payer: 59 | Admitting: Family Medicine

## 2011-08-12 VITALS — BP 122/76 | HR 75 | Wt 157.6 lb

## 2011-08-12 DIAGNOSIS — F430 Acute stress reaction: Secondary | ICD-10-CM

## 2011-08-12 DIAGNOSIS — Z23 Encounter for immunization: Secondary | ICD-10-CM

## 2011-08-12 MED ORDER — ALPRAZOLAM 0.25 MG PO TABS: 0.2500 mg | ORAL_TABLET | Freq: Three times a day (TID) | ORAL | Status: DC | PRN

## 2011-08-12 NOTE — Progress Notes (Signed)
  Subjective:    Patient ID: Gina Pineda, female    DOB: [birth year], 40 y.o.   MRN: [id number]  HPI Pt here f/u bp.  She was scheduled for surgery to donate a kidney and her bp was high so surgery was cancelled.  Pt is under a lot of stress.   Review of Systems As above    Objective:   Physical Exam  Constitutional: She is oriented to person, place, and time. She appears well-developed and well-nourished.  Neurological: She is alert and oriented to person, place, and time.       + crying  Psychiatric: She has a normal mood and affect. Her behavior is normal. Judgment and thought content normal.          Assessment & Plan:  1.  Stress reaction-- xanax 0.25 tid prn

## 2011-08-12 NOTE — Progress Notes (Signed)
Addended by: Arnette Norris on: 08/12/2011 05:58 PM   Modules accepted: Orders

## 2011-08-12 NOTE — Patient Instructions (Signed)
Stress Management Stress is a state of physical or mental tension that often results from changes in your life or normal routine. Some common causes of stress are:  Death of a loved one.   Injuries or severe illnesses.   Getting fired or changing jobs.   Moving into a new home.  Other causes may be:  Sexual problems.  Business or financial losses.   Taking on a large debt.   Regular conflict with someone at home or at work.  Constant tiredness from lack of sleep.   It is not just bad things that are stressful. It may be stressful to:  Win the lottery.   Get married.   Buy a new car.  The amount of stress that can be easily tolerated varies from person to person. Changes generally cause stress, regardless of the types of change. Too much stress can affect your health. It may lead to physical or emotional problems. Too little stress (boredom) may also become stressful. SUGGESTIONS TO REDUCE STRESS:  Talk things over with your family and friends. It often is helpful to share your concerns and worries. If you feel your problem is serious, you may want to get help from a professional counselor.   Consider your problems one at a time instead of lumping them all together. Trying to take care of everything at once may seem impossible. List all the things you need to do and then start with the most important one. Set a goal to accomplish 2 or 3 things each day. If you expect to do too many in a single day you will naturally fail, causing you to feel even more stressed.   Do not use alcohol or drugs to relieve stress. Although you may feel better for a short time, they do not remove the problems that caused the stress. They can also be habit forming.   Exercise regularly - at least 3 times per week. Physical exercise can help to relieve that "uptight" feeling and will relax you.   The shortest distance between despair and hope is often a good night's sleep.   Go to bed and get up on  time allowing yourself time for appointments without being rushed.   Take a short "time-out" period from any stressful situation that occurs during the day. Close your eyes and take some deep breaths. Starting with the muscles in your face, tense them, hold it for a few seconds, then relax. Repeat this with the muscles in your neck, shoulders, hand, stomach, back and legs.   Take good care of yourself. Eat a balanced diet and get plenty of rest.   Schedule time for having fun. Take a break from your daily routine to relax.  HOME CARE INSTRUCTIONS  Call if you feel overwhelmed by your problems and feel you can no longer manage them on your own.   Return immediately if you feel like hurting yourself or someone else.  Document Released: 04/26/2001 Document Re-Released: 08/09/2008 [facility] Patient Information 2011 [place], [place].

## 2012-05-29 ENCOUNTER — Ambulatory Visit (INDEPENDENT_AMBULATORY_CARE_PROVIDER_SITE_OTHER): Payer: 59 | Admitting: Family Medicine

## 2012-05-29 ENCOUNTER — Encounter: Payer: Self-pay | Admitting: Family Medicine

## 2012-05-29 VITALS — BP 144/86 | HR 75 | Temp 98.5°F | Wt 160.4 lb

## 2012-05-29 DIAGNOSIS — IMO0001 Reserved for inherently not codable concepts without codable children: Secondary | ICD-10-CM

## 2012-05-29 DIAGNOSIS — R030 Elevated blood-pressure reading, without diagnosis of hypertension: Secondary | ICD-10-CM

## 2012-05-29 DIAGNOSIS — R609 Edema, unspecified: Secondary | ICD-10-CM

## 2012-05-29 DIAGNOSIS — M791 Myalgia, unspecified site: Secondary | ICD-10-CM

## 2012-05-29 DIAGNOSIS — E039 Hypothyroidism, unspecified: Secondary | ICD-10-CM

## 2012-05-29 DIAGNOSIS — R1310 Dysphagia, unspecified: Secondary | ICD-10-CM

## 2012-05-29 LAB — CBC WITH DIFFERENTIAL/PLATELET
Basophils Absolute: 0 10*3/uL (ref 0.0–0.1)
Basophils Relative: 0.4 % (ref 0.0–3.0)
HCT: 44.5 % (ref 36.0–46.0)
Hemoglobin: 14.9 g/dL (ref 12.0–15.0)
Lymphs Abs: 1.1 10*3/uL (ref 0.7–4.0)
MCHC: 33.5 g/dL (ref 30.0–36.0)
Monocytes Relative: 6.9 % (ref 3.0–12.0)
Neutro Abs: 2.7 10*3/uL (ref 1.4–7.7)
RBC: 4.69 Mil/uL (ref 3.87–5.11)
RDW: 12.8 % (ref 11.5–14.6)

## 2012-05-29 MED ORDER — HYDROCHLOROTHIAZIDE 25 MG PO TABS: 25.0000 mg | ORAL_TABLET | Freq: Every day | ORAL | Status: DC

## 2012-05-29 NOTE — Patient Instructions (Addendum)

## 2012-05-29 NOTE — Progress Notes (Signed)
  Subjective:     Gina Pineda is a 41 y.o. female who presents for follow up of hypothyroidism. Current symptoms: change in energy level, nervousness and hair loss and extreme fatigue. Patient denies change in energy level, heat / cold intolerance, nervousness and palpitations. Symptoms have gradually worsened.  The following portions of the patient's history were reviewed and updated as appropriate: allergies, current medications, past family history, past medical history, past social history, past surgical history and problem list.  Review of Systems Pertinent items are noted in HPI.    Objective:    BP 144/86  Pulse 75  Temp 98.5 F (36.9 C) (Oral)  Wt 160 lb 6.4 oz (72.757 kg)  SpO2 97% General appearance: alert, cooperative, appears stated age and no distress Throat: lips, mucosa, and tongue normal; teeth and gums normal Neck: no adenopathy, supple, symmetrical, trachea midline and thyroid not enlarged, symmetric, no tenderness/mass/nodules Heart: S1, S2 normal Extremities: extremities normal, atraumatic, no cyanosis or edema  Laboratory: Lab Results  Component Value Date   TSH 1.90 06/01/2011      Assessment:    Hypothyroidism.  Replacement con't.    edema Elevated bp--hctz 25 mg   1 po qd for bp and edema Plan:    1. L-thyroxine per orders. 2. Recheck thyroid function tests in pending todays results. 3. Instructed not to take multivitamins or iron within 4 hours of taking thyroid medications. 4. Follow up in 2 months.

## 2012-05-30 LAB — BASIC METABOLIC PANEL
CO2: 21 mEq/L (ref 19–32)
Glucose, Bld: 99 mg/dL (ref 70–99)
Potassium: 3.7 mEq/L (ref 3.5–5.1)
Sodium: 137 mEq/L (ref 135–145)

## 2012-05-30 LAB — THYROID ANTIBODIES (THYROPEROXIDASE & THYROGLOBULIN)
Thyroglobulin Ab: <20 U/mL
Thyroperoxidase Ab SerPl-aCnc: 454 [IU]/mL — ABNORMAL HIGH

## 2012-05-30 LAB — T4, FREE: Free T4: 1.06 ng/dL (ref 0.60–1.60)

## 2012-05-30 LAB — ANTI-NUCLEAR AB-TITER (ANA TITER)

## 2012-05-30 LAB — ANA: Anti Nuclear Antibody(ANA): POSITIVE — AB

## 2012-05-30 LAB — RHEUMATOID FACTOR: Rheumatoid fact SerPl-aCnc: <10 [IU]/mL

## 2012-05-30 LAB — VITAMIN B12: Vitamin B-12: 408 pg/mL (ref 211–911)

## 2012-05-31 ENCOUNTER — Telehealth: Payer: Self-pay | Admitting: Family Medicine

## 2012-05-31 NOTE — Telephone Encounter (Signed)
Please call pt. Back at [phone], regarding the referral in pt. appt desk. I am not sure of the acronyms so I was not very helpful but I did read the information list under the schedule tab wasn't sure about [facility]

## 2012-06-01 ENCOUNTER — Telehealth: Payer: Self-pay

## 2012-06-01 ENCOUNTER — Ambulatory Visit (HOSPITAL_BASED_OUTPATIENT_CLINIC_OR_DEPARTMENT_OTHER)
Admission: RE | Admit: 2012-06-01 | Discharge: 2012-06-01 | Disposition: A | Discharge status: Home / Self Care | Payer: 59 | Source: Ambulatory Visit | Attending: Family Medicine | Admitting: Family Medicine

## 2012-06-01 DIAGNOSIS — R768 Other specified abnormal immunological findings in serum: Secondary | ICD-10-CM

## 2012-06-01 DIAGNOSIS — R1310 Dysphagia, unspecified: Secondary | ICD-10-CM | POA: Insufficient documentation

## 2012-06-01 DIAGNOSIS — F430 Acute stress reaction: Secondary | ICD-10-CM

## 2012-06-01 DIAGNOSIS — R5381 Other malaise: Secondary | ICD-10-CM | POA: Insufficient documentation

## 2012-06-01 DIAGNOSIS — E049 Nontoxic goiter, unspecified: Secondary | ICD-10-CM | POA: Insufficient documentation

## 2012-06-01 DIAGNOSIS — E063 Autoimmune thyroiditis: Secondary | ICD-10-CM | POA: Insufficient documentation

## 2012-06-01 DIAGNOSIS — R946 Abnormal results of thyroid function studies: Secondary | ICD-10-CM

## 2012-06-01 LAB — VITAMIN D 1,25 DIHYDROXY
Vitamin D 1, 25 (OH)2 Total: 52 pg/mL (ref 18–72)
Vitamin D3 1, 25 (OH)2: 52 pg/mL

## 2012-06-01 MED ORDER — ALPRAZOLAM 0.25 MG PO TABS: 0.5000 mg | ORAL_TABLET | Freq: Three times a day (TID) | ORAL | Status: DC | PRN

## 2012-06-01 NOTE — Telephone Encounter (Signed)
Refer to rheum for elevated ANA  Abn thyroid antibodies---- Refer to endo ANA abnormal--- but waiting for final   Discussed with patient and she is still having the pounding heart sensation and feels jittery and she is due to go out of town and wanted to know if this will be ok, she said the xanax takes some of the edge off but she does not want to be masking a real problem. Please advise       KP

## 2012-06-01 NOTE — Telephone Encounter (Signed)
Discussed with patient and I made her aware of Dr.Lowne's recommendations. She voiced understanding and Rx faxed.     KP

## 2012-06-01 NOTE — Telephone Encounter (Signed)
That may be coming from thyroid -- can increase xanax to 0.5---( 2 of what she has).    If it really doesn't help with jittery feeling it may be best she stay close to home.  We will get her in as soon as possible.

## 2012-06-01 NOTE — Telephone Encounter (Signed)
msg left to call the office     KP 

## 2012-06-05 ENCOUNTER — Ambulatory Visit (INDEPENDENT_AMBULATORY_CARE_PROVIDER_SITE_OTHER): Payer: 59 | Admitting: Family Medicine

## 2012-06-05 DIAGNOSIS — I10 Essential (primary) hypertension: Secondary | ICD-10-CM

## 2012-06-05 DIAGNOSIS — Z23 Encounter for immunization: Secondary | ICD-10-CM

## 2012-06-05 NOTE — Progress Notes (Signed)
  Subjective:    Patient ID: Gina Pineda, female    DOB: [birth year], 41 y.o.   MRN: [id number]  HPI  Pt here to get ekg and pneumovac only  Review of Systems     Objective:   Physical Exam        Assessment & Plan:

## 2012-06-15 ENCOUNTER — Telehealth: Payer: Self-pay | Admitting: Family Medicine

## 2012-06-15 MED ORDER — SYNTHROID 50 MCG PO TABS: 50.0000 ug | ORAL_TABLET | Freq: Every day | ORAL | Status: DC

## 2012-06-15 NOTE — Telephone Encounter (Signed)
Refill: Synthroid tablet. Take 1 tablet by mouth daily. Qty 30. Last fill 05-09-12

## 2012-06-18 ENCOUNTER — Ambulatory Visit: Payer: 59 | Admitting: Endocrinology

## 2012-06-26 ENCOUNTER — Telehealth: Payer: Self-pay

## 2012-06-26 NOTE — Telephone Encounter (Signed)
Please schedule.    KP 

## 2012-06-26 NOTE — Telephone Encounter (Signed)
Please advise      KP 

## 2012-06-26 NOTE — Telephone Encounter (Signed)
yes

## 2012-06-26 NOTE — Telephone Encounter (Signed)
lmovm for pt to call office. °

## 2012-06-26 NOTE — Telephone Encounter (Signed)
Message copied by Arnette Norris on Tue Jun 26, 2012 10:39 AM ------      Message from: Marshell Garfinkel      Created: Tue Jun 26, 2012  9:31 AM      Contact: [phone]       Pt states she has standing order for labs that her rheumatologist has requested. She would like to come here to have the labs done and has the order on paper. Is it okay for her to do that?

## 2012-06-26 NOTE — Telephone Encounter (Signed)
Pt returned call. Coming in this Friday.

## 2012-06-29 ENCOUNTER — Other Ambulatory Visit (INDEPENDENT_AMBULATORY_CARE_PROVIDER_SITE_OTHER): Payer: 59

## 2012-06-29 DIAGNOSIS — Z79899 Other long term (current) drug therapy: Secondary | ICD-10-CM

## 2012-06-29 LAB — CBC WITH DIFFERENTIAL/PLATELET
Eosinophils Relative: 1.7 % (ref 0.0–5.0)
HCT: 42.8 % (ref 36.0–46.0)
Hemoglobin: 14.4 g/dL (ref 12.0–15.0)
Lymphs Abs: 1.2 10*3/uL (ref 0.7–4.0)
MCV: 94.1 fl (ref 78.0–100.0)
Monocytes Absolute: 0.5 10*3/uL (ref 0.1–1.0)
Monocytes Relative: 12.7 % — ABNORMAL HIGH (ref 3.0–12.0)
Neutro Abs: 1.9 10*3/uL (ref 1.4–7.7)
Platelets: 184 10*3/uL (ref 150.0–400.0)
WBC: 3.7 10*3/uL — ABNORMAL LOW (ref 4.5–10.5)

## 2012-06-29 LAB — ALT: ALT: 16 U/L (ref 0–35)

## 2012-06-29 NOTE — Progress Notes (Signed)
Labs only

## 2012-07-12 ENCOUNTER — Other Ambulatory Visit: Payer: Self-pay | Admitting: Family Medicine

## 2012-10-02 ENCOUNTER — Other Ambulatory Visit (INDEPENDENT_AMBULATORY_CARE_PROVIDER_SITE_OTHER): Payer: 59

## 2012-10-02 DIAGNOSIS — Z79899 Other long term (current) drug therapy: Secondary | ICD-10-CM

## 2012-10-02 LAB — CBC WITH DIFFERENTIAL/PLATELET
Basophils Absolute: 0 10*3/uL (ref 0.0–0.1)
Eosinophils Absolute: 0.1 10*3/uL (ref 0.0–0.7)
HCT: 40.7 % (ref 36.0–46.0)
Hemoglobin: 13.6 g/dL (ref 12.0–15.0)
Lymphs Abs: 1.1 10*3/uL (ref 0.7–4.0)
MCHC: 33.3 g/dL (ref 30.0–36.0)
MCV: 95.3 fl (ref 78.0–100.0)
Monocytes Absolute: 0.4 10*3/uL (ref 0.1–1.0)
Monocytes Relative: 9 % (ref 3.0–12.0)
Neutro Abs: 2.3 10*3/uL (ref 1.4–7.7)
Platelets: 181 10*3/uL (ref 150.0–400.0)
RDW: 13.4 % (ref 11.5–14.6)

## 2012-10-02 LAB — ALBUMIN: Albumin: 4.1 g/dL (ref 3.5–5.2)

## 2012-12-29 ENCOUNTER — Other Ambulatory Visit: Payer: Self-pay

## 2013-04-04 ENCOUNTER — Telehealth: Payer: Self-pay | Admitting: General Practice

## 2013-04-04 DIAGNOSIS — F430 Acute stress reaction: Secondary | ICD-10-CM

## 2013-04-04 MED ORDER — ALPRAZOLAM 0.25 MG PO TABS: 0.5000 mg | ORAL_TABLET | Freq: Three times a day (TID) | ORAL | Status: DC | PRN

## 2013-04-04 NOTE — Telephone Encounter (Signed)
Refill x1 

## 2013-04-04 NOTE — Telephone Encounter (Signed)
Alprazolam 0.25mg  Last OV 06-05-12 Med last filled 06-01-12 #90.

## 2013-04-04 NOTE — Telephone Encounter (Signed)
message left to advise OV due now.    KP

## 2013-05-14 ENCOUNTER — Encounter: Payer: 59 | Admitting: Family Medicine

## 2013-05-30 ENCOUNTER — Other Ambulatory Visit: Payer: Self-pay | Admitting: Family Medicine

## 2013-06-27 ENCOUNTER — Encounter: Payer: Self-pay | Admitting: Lab

## 2013-06-28 ENCOUNTER — Encounter: Payer: Self-pay | Admitting: Family Medicine

## 2013-06-28 ENCOUNTER — Ambulatory Visit (INDEPENDENT_AMBULATORY_CARE_PROVIDER_SITE_OTHER): Payer: 59 | Admitting: Family Medicine

## 2013-06-28 VITALS — BP 116/84 | HR 85 | Temp 99.2°F | Wt 157.6 lb

## 2013-06-28 DIAGNOSIS — M47812 Spondylosis without myelopathy or radiculopathy, cervical region: Secondary | ICD-10-CM

## 2013-06-28 DIAGNOSIS — R591 Generalized enlarged lymph nodes: Secondary | ICD-10-CM

## 2013-06-28 DIAGNOSIS — M479 Spondylosis, unspecified: Secondary | ICD-10-CM

## 2013-06-28 DIAGNOSIS — M47815 Spondylosis without myelopathy or radiculopathy, thoracolumbar region: Secondary | ICD-10-CM

## 2013-06-28 DIAGNOSIS — R599 Enlarged lymph nodes, unspecified: Secondary | ICD-10-CM

## 2013-06-28 LAB — CBC WITH DIFFERENTIAL/PLATELET
Basophils Absolute: 0 10*3/uL (ref 0.0–0.1)
Eosinophils Relative: 1.3 % (ref 0.0–5.0)
HCT: 46.3 % — ABNORMAL HIGH (ref 36.0–46.0)
Hemoglobin: 15.6 g/dL — ABNORMAL HIGH (ref 12.0–15.0)
Lymphs Abs: 1.6 10*3/uL (ref 0.7–4.0)
MCV: 95 fl (ref 78.0–100.0)
Monocytes Absolute: 0.6 10*3/uL (ref 0.1–1.0)
Monocytes Relative: 9.9 % (ref 3.0–12.0)
Neutro Abs: 4 10*3/uL (ref 1.4–7.7)
RDW: 12.8 % (ref 11.5–14.6)

## 2013-06-28 LAB — HEPATIC FUNCTION PANEL
Albumin: 4.8 g/dL (ref 3.5–5.2)
Alkaline Phosphatase: 40 U/L (ref 39–117)

## 2013-06-28 LAB — BASIC METABOLIC PANEL
CO2: 26 mEq/L (ref 19–32)
Calcium: 9.5 mg/dL (ref 8.4–10.5)
Creatinine, Ser: 0.8 mg/dL (ref 0.4–1.2)
Glucose, Bld: 95 mg/dL (ref 70–99)

## 2013-06-28 MED ORDER — MELOXICAM 15 MG PO TABS: ORAL_TABLET | ORAL | Status: DC

## 2013-06-28 NOTE — Patient Instructions (Signed)
Preventive Care for Adults, Female A healthy lifestyle and preventive care can promote health and wellness. Preventive health guidelines for women include the following key practices.  A routine yearly physical is a good way to check with your caregiver about your health and preventive screening. It is a chance to share any concerns and updates on your health, and to receive a thorough exam.  Visit your dentist for a routine exam and preventive care every 6 months. Brush your teeth twice a day and floss once a day. Good oral hygiene prevents tooth decay and gum disease.  The frequency of eye exams is based on your age, health, family medical history, use of contact lenses, and other factors. Follow your caregiver's recommendations for frequency of eye exams.  Eat a healthy diet. Foods like vegetables, fruits, whole grains, low-fat dairy products, and lean protein foods contain the nutrients you need without too many calories. Decrease your intake of foods high in solid fats, added sugars, and salt. Eat the right amount of calories for you.Get information about a proper diet from your caregiver, if necessary.  Regular physical exercise is one of the most important things you can do for your health. Most adults should get at least 150 minutes of moderate-intensity exercise (any activity that increases your heart rate and causes you to sweat) each week. In addition, most adults need muscle-strengthening exercises on 2 or more days a week.  Maintain a healthy weight. The body mass index (BMI) is a screening tool to identify possible weight problems. It provides an estimate of body fat based on height and weight. Your caregiver can help determine your BMI, and can help you achieve or maintain a healthy weight.For adults 20 years and older:  A BMI below 18.5 is considered underweight.  A BMI of 18.5 to 24.9 is normal.  A BMI of 25 to 29.9 is considered overweight.  A BMI of 30 and above is  considered obese.  Maintain normal blood lipids and cholesterol levels by exercising and minimizing your intake of saturated fat. Eat a balanced diet with plenty of fruit and vegetables. Blood tests for lipids and cholesterol should begin at age 20 and be repeated every 5 years. If your lipid or cholesterol levels are high, you are over 50, or you are at high risk for heart disease, you may need your cholesterol levels checked more frequently.Ongoing high lipid and cholesterol levels should be treated with medicines if diet and exercise are not effective.  If you smoke, find out from your caregiver how to quit. If you do not use tobacco, do not start.  If you are pregnant, do not drink alcohol. If you are breastfeeding, be very cautious about drinking alcohol. If you are not pregnant and choose to drink alcohol, do not exceed 1 drink per day. One drink is considered to be 12 ounces (355 mL) of beer, 5 ounces (148 mL) of wine, or 1.5 ounces (44 mL) of liquor.  Avoid use of street drugs. Do not share needles with anyone. Ask for help if you need support or instructions about stopping the use of drugs.  High blood pressure causes heart disease and increases the risk of stroke. Your blood pressure should be checked at least every 1 to 2 years. Ongoing high blood pressure should be treated with medicines if weight loss and exercise are not effective.  If you are 55 to 42 years old, ask your caregiver if you should take aspirin to prevent strokes.  Diabetes   screening involves taking a blood sample to check your fasting blood sugar level. This should be done once every 3 years, after age 45, if you are within normal weight and without risk factors for diabetes. Testing should be considered at a younger age or be carried out more frequently if you are overweight and have at least 1 risk factor for diabetes.  Breast cancer screening is essential preventive care for women. You should practice "breast  self-awareness." This means understanding the normal appearance and feel of your breasts and may include breast self-examination. Any changes detected, no matter how small, should be reported to a caregiver. Women in their 20s and 30s should have a clinical breast exam (CBE) by a caregiver as part of a regular health exam every 1 to 3 years. After age 40, women should have a CBE every year. Starting at age 40, women should consider having a mammography (breast X-ray test) every year. Women who have a family history of breast cancer should talk to their caregiver about genetic screening. Women at a high risk of breast cancer should talk to their caregivers about having magnetic resonance imaging (MRI) and a mammography every year.  The Pap test is a screening test for cervical cancer. A Pap test can show cell changes on the cervix that might become cervical cancer if left untreated. A Pap test is a procedure in which cells are obtained and examined from the lower end of the uterus (cervix).  Women should have a Pap test starting at age 21.  Between ages 21 and 29, Pap tests should be repeated every 2 years.  Beginning at age 30, you should have a Pap test every 3 years as long as the past 3 Pap tests have been normal.  Some women have medical problems that increase the chance of getting cervical cancer. Talk to your caregiver about these problems. It is especially important to talk to your caregiver if a new problem develops soon after your last Pap test. In these cases, your caregiver may recommend more frequent screening and Pap tests.  The above recommendations are the same for women who have or have not gotten the vaccine for human papillomavirus (HPV).  If you had a hysterectomy for a problem that was not cancer or a condition that could lead to cancer, then you no longer need Pap tests. Even if you no longer need a Pap test, a regular exam is a good idea to make sure no other problems are  starting.  If you are between ages 65 and 70, and you have had normal Pap tests going back 10 years, you no longer need Pap tests. Even if you no longer need a Pap test, a regular exam is a good idea to make sure no other problems are starting.  If you have had past treatment for cervical cancer or a condition that could lead to cancer, you need Pap tests and screening for cancer for at least 20 years after your treatment.  If Pap tests have been discontinued, risk factors (such as a new sexual partner) need to be reassessed to determine if screening should be resumed.  The HPV test is an additional test that may be used for cervical cancer screening. The HPV test looks for the virus that can cause the cell changes on the cervix. The cells collected during the Pap test can be tested for HPV. The HPV test could be used to screen women aged 30 years and older, and should   be used in women of any age who have unclear Pap test results. After the age of 30, women should have HPV testing at the same frequency as a Pap test.  Colorectal cancer can be detected and often prevented. Most routine colorectal cancer screening begins at the age of 50 and continues through age 75. However, your caregiver may recommend screening at an earlier age if you have risk factors for colon cancer. On a yearly basis, your caregiver may provide home test kits to check for hidden blood in the stool. Use of a small camera at the end of a tube, to directly examine the colon (sigmoidoscopy or colonoscopy), can detect the earliest forms of colorectal cancer. Talk to your caregiver about this at age 50, when routine screening begins. Direct examination of the colon should be repeated every 5 to 10 years through age 75, unless early forms of pre-cancerous polyps or small growths are found.  Hepatitis C blood testing is recommended for all people born from 1945 through 1965 and any individual with known risks for hepatitis C.  Practice  safe sex. Use condoms and avoid high-risk sexual practices to reduce the spread of sexually transmitted infections (STIs). STIs include gonorrhea, chlamydia, syphilis, trichomonas, herpes, HPV, and human immunodeficiency virus (HIV). Herpes, HIV, and HPV are viral illnesses that have no cure. They can result in disability, cancer, and death. Sexually active women aged 25 and younger should be checked for chlamydia. Older women with new or multiple partners should also be tested for chlamydia. Testing for other STIs is recommended if you are sexually active and at increased risk.  Osteoporosis is a disease in which the bones lose minerals and strength with aging. This can result in serious bone fractures. The risk of osteoporosis can be identified using a bone density scan. Women ages 65 and over and women at risk for fractures or osteoporosis should discuss screening with their caregivers. Ask your caregiver whether you should take a calcium supplement or vitamin D to reduce the rate of osteoporosis.  Menopause can be associated with physical symptoms and risks. Hormone replacement therapy is available to decrease symptoms and risks. You should talk to your caregiver about whether hormone replacement therapy is right for you.  Use sunscreen with sun protection factor (SPF) of 30 or more. Apply sunscreen liberally and repeatedly throughout the day. You should seek shade when your shadow is shorter than you. Protect yourself by wearing long sleeves, pants, a wide-brimmed hat, and sunglasses year round, whenever you are outdoors.  Once a month, do a whole body skin exam, using a mirror to look at the skin on your back. Notify your caregiver of new moles, moles that have irregular borders, moles that are larger than a pencil eraser, or moles that have changed in shape or color.  Stay current with required immunizations.  Influenza. You need a dose every fall (or winter). The composition of the flu vaccine  changes each year, so being vaccinated once is not enough.  Pneumococcal polysaccharide. You need 1 to 2 doses if you smoke cigarettes or if you have certain chronic medical conditions. You need 1 dose at age 65 (or older) if you have never been vaccinated.  Tetanus, diphtheria, pertussis (Tdap, Td). Get 1 dose of Tdap vaccine if you are younger than age 65, are over 65 and have contact with an infant, are a healthcare worker, are pregnant, or simply want to be protected from whooping cough. After that, you need a Td   booster dose every 10 years. Consult your caregiver if you have not had at least 3 tetanus and diphtheria-containing shots sometime in your life or have a deep or dirty wound.  HPV. You need this vaccine if you are a woman age 26 or younger. The vaccine is given in 3 doses over 6 months.  Measles, mumps, rubella (MMR). You need at least 1 dose of MMR if you were born in 1957 or later. You may also need a second dose.  Meningococcal. If you are age 19 to 21 and a first-year college student living in a residence hall, or have one of several medical conditions, you need to get vaccinated against meningococcal disease. You may also need additional booster doses.  Zoster (shingles). If you are age 60 or older, you should get this vaccine.  Varicella (chickenpox). If you have never had chickenpox or you were vaccinated but received only 1 dose, talk to your caregiver to find out if you need this vaccine.  Hepatitis A. You need this vaccine if you have a specific risk factor for hepatitis A virus infection or you simply wish to be protected from this disease. The vaccine is usually given as 2 doses, 6 to 18 months apart.  Hepatitis B. You need this vaccine if you have a specific risk factor for hepatitis B virus infection or you simply wish to be protected from this disease. The vaccine is given in 3 doses, usually over 6 months. Preventive Services / Frequency Ages 19 to 39  Blood  pressure check.** / Every 1 to 2 years.  Lipid and cholesterol check.** / Every 5 years beginning at age 20.  Clinical breast exam.** / Every 3 years for women in their 20s and 30s.  Pap test.** / Every 2 years from ages 21 through 29. Every 3 years starting at age 30 through age 65 or 70 with a history of 3 consecutive normal Pap tests.  HPV screening.** / Every 3 years from ages 30 through ages 65 to 70 with a history of 3 consecutive normal Pap tests.  Hepatitis C blood test.** / For any individual with known risks for hepatitis C.  Skin self-exam. / Monthly.  Influenza immunization.** / Every year.  Pneumococcal polysaccharide immunization.** / 1 to 2 doses if you smoke cigarettes or if you have certain chronic medical conditions.  Tetanus, diphtheria, pertussis (Tdap, Td) immunization. / A one-time dose of Tdap vaccine. After that, you need a Td booster dose every 10 years.  HPV immunization. / 3 doses over 6 months, if you are 26 and younger.  Measles, mumps, rubella (MMR) immunization. / You need at least 1 dose of MMR if you were born in 1957 or later. You may also need a second dose.  Meningococcal immunization. / 1 dose if you are age 19 to 21 and a first-year college student living in a residence hall, or have one of several medical conditions, you need to get vaccinated against meningococcal disease. You may also need additional booster doses.  Varicella immunization.** / Consult your caregiver.  Hepatitis A immunization.** / Consult your caregiver. 2 doses, 6 to 18 months apart.  Hepatitis B immunization.** / Consult your caregiver. 3 doses usually over 6 months. Ages 40 to 64  Blood pressure check.** / Every 1 to 2 years.  Lipid and cholesterol check.** / Every 5 years beginning at age 20.  Clinical breast exam.** / Every year after age 40.  Mammogram.** / Every year beginning at age 40   and continuing for as long as you are in good health. Consult with your  caregiver.  Pap test.** / Every 3 years starting at age 30 through age 65 or 70 with a history of 3 consecutive normal Pap tests.  HPV screening.** / Every 3 years from ages 30 through ages 65 to 70 with a history of 3 consecutive normal Pap tests.  Fecal occult blood test (FOBT) of stool. / Every year beginning at age 50 and continuing until age 75. You may not need to do this test if you get a colonoscopy every 10 years.  Flexible sigmoidoscopy or colonoscopy.** / Every 5 years for a flexible sigmoidoscopy or every 10 years for a colonoscopy beginning at age 50 and continuing until age 75.  Hepatitis C blood test.** / For all people born from 1945 through 1965 and any individual with known risks for hepatitis C.  Skin self-exam. / Monthly.  Influenza immunization.** / Every year.  Pneumococcal polysaccharide immunization.** / 1 to 2 doses if you smoke cigarettes or if you have certain chronic medical conditions.  Tetanus, diphtheria, pertussis (Tdap, Td) immunization.** / A one-time dose of Tdap vaccine. After that, you need a Td booster dose every 10 years.  Measles, mumps, rubella (MMR) immunization. / You need at least 1 dose of MMR if you were born in 1957 or later. You may also need a second dose.  Varicella immunization.** / Consult your caregiver.  Meningococcal immunization.** / Consult your caregiver.  Hepatitis A immunization.** / Consult your caregiver. 2 doses, 6 to 18 months apart.  Hepatitis B immunization.** / Consult your caregiver. 3 doses, usually over 6 months. Ages 65 and over  Blood pressure check.** / Every 1 to 2 years.  Lipid and cholesterol check.** / Every 5 years beginning at age 20.  Clinical breast exam.** / Every year after age 40.  Mammogram.** / Every year beginning at age 40 and continuing for as long as you are in good health. Consult with your caregiver.  Pap test.** / Every 3 years starting at age 30 through age 65 or 70 with a 3  consecutive normal Pap tests. Testing can be stopped between 65 and 70 with 3 consecutive normal Pap tests and no abnormal Pap or HPV tests in the past 10 years.  HPV screening.** / Every 3 years from ages 30 through ages 65 or 70 with a history of 3 consecutive normal Pap tests. Testing can be stopped between 65 and 70 with 3 consecutive normal Pap tests and no abnormal Pap or HPV tests in the past 10 years.  Fecal occult blood test (FOBT) of stool. / Every year beginning at age 50 and continuing until age 75. You may not need to do this test if you get a colonoscopy every 10 years.  Flexible sigmoidoscopy or colonoscopy.** / Every 5 years for a flexible sigmoidoscopy or every 10 years for a colonoscopy beginning at age 50 and continuing until age 75.  Hepatitis C blood test.** / For all people born from 1945 through 1965 and any individual with known risks for hepatitis C.  Osteoporosis screening.** / A one-time screening for women ages 65 and over and women at risk for fractures or osteoporosis.  Skin self-exam. / Monthly.  Influenza immunization.** / Every year.  Pneumococcal polysaccharide immunization.** / 1 dose at age 65 (or older) if you have never been vaccinated.  Tetanus, diphtheria, pertussis (Tdap, Td) immunization. / A one-time dose of Tdap vaccine if you are over   65 and have contact with an infant, are a healthcare worker, or simply want to be protected from whooping cough. After that, you need a Td booster dose every 10 years.  Varicella immunization.** / Consult your caregiver.  Meningococcal immunization.** / Consult your caregiver.  Hepatitis A immunization.** / Consult your caregiver. 2 doses, 6 to 18 months apart.  Hepatitis B immunization.** / Check with your caregiver. 3 doses, usually over 6 months. ** Family history and personal history of risk and conditions may change your caregiver's recommendations. Document Released: 12/27/2001 Document Revised: 01/23/2012  Document Reviewed: 03/28/2011 [employer] Patient Information 2014 [employer].  

## 2013-06-28 NOTE — Progress Notes (Signed)
  Subjective:    Gina Pineda is a 42 y.o. female who presents for follow up of low back problems. Current symptoms include: pain in neck and low back (aching and throbbing in character; 8/10 in severity) and stiffness in both places. Symptoms have significantly worsened from the previous visit. Exacerbating factors identified by the patient are bending forwards, sitting, standing and walking.  Pt also c/o enlarged lymph nodes.     The following portions of the patient's history were reviewed and updated as appropriate: allergies, current medications, past family history, past medical history, past social history, past surgical history and problem list.    Objective:    BP 116/84  Pulse 85  Temp(Src) 99.2 F (37.3 C) (Oral)  Wt 157 lb 9.6 oz (71.487 kg)  BMI 25.82 kg/m2  SpO2 98% General appearance: alert, cooperative, appears stated age and no distress Throat: lips, mucosa, and tongue normal; teeth and gums normal Neck: no carotid bruit, supple, symmetrical, trachea midline and post cervical adenopathy Lungs: clear to auscultation bilaterally Heart: S1, S2 normal Extremities: extremities normal, atraumatic, no cyanosis or edema Lymph nodes: Cervical adenopathy: L sided post cervical adenopathy    Assessment:    Nonspecific acute low back pain nonspecific neck pain   cervical adenopathy Plan:    Natural history and expected course discussed. Questions answered. Short (2-4 day) period of relative rest recommended until acute symptoms improve. Ice to affected area as needed for local pain relief. Heat to affected area as needed for local pain relief. check labs  Consider US neck to look at adenopathy if persists Xray neck Low back xray done by rheum--+ arthritis Consider PT at med center

## 2013-07-01 ENCOUNTER — Other Ambulatory Visit: Payer: Self-pay

## 2013-07-01 MED ORDER — POTASSIUM CHLORIDE CRYS ER 20 MEQ PO TBCR: 20.0000 meq | EXTENDED_RELEASE_TABLET | Freq: Every day | ORAL | Status: DC

## 2013-07-08 ENCOUNTER — Other Ambulatory Visit: Payer: Self-pay | Admitting: Family Medicine

## 2013-07-08 ENCOUNTER — Encounter: Payer: 59 | Admitting: Family Medicine

## 2013-07-08 ENCOUNTER — Ambulatory Visit (HOSPITAL_BASED_OUTPATIENT_CLINIC_OR_DEPARTMENT_OTHER)
Admission: RE | Admit: 2013-07-08 | Discharge: 2013-07-08 | Disposition: A | Discharge status: Home / Self Care | Payer: 59 | Source: Ambulatory Visit | Attending: Family Medicine | Admitting: Family Medicine

## 2013-07-08 DIAGNOSIS — Z1231 Encounter for screening mammogram for malignant neoplasm of breast: Secondary | ICD-10-CM

## 2013-07-08 DIAGNOSIS — R922 Inconclusive mammogram: Secondary | ICD-10-CM | POA: Insufficient documentation

## 2013-07-08 DIAGNOSIS — M5030 Other cervical disc degeneration, unspecified cervical region: Secondary | ICD-10-CM | POA: Insufficient documentation

## 2013-07-08 DIAGNOSIS — M47812 Spondylosis without myelopathy or radiculopathy, cervical region: Secondary | ICD-10-CM

## 2013-07-19 ENCOUNTER — Telehealth: Payer: Self-pay | Admitting: General Practice

## 2013-07-19 NOTE — Telephone Encounter (Signed)
Noted. Will wait on Xray results.

## 2013-07-19 NOTE — Telephone Encounter (Signed)
Message on triage line: Pt called stating that she wanted results of an xray and needs a prescription refilled. Pt did not leave what med she needed filled or to what pharmacy. LMOVM for pt to return calls.

## 2013-07-19 NOTE — Telephone Encounter (Signed)
It is the water pill that is lowering k which is why we added KCL.  We are trying to figure out what happened with xray--- radiology working on it

## 2013-07-19 NOTE — Telephone Encounter (Signed)
Pt informed she had a question about her potassium levels. States that she has been on a water pill for over a year could this lower her potassium levels. Pt also states that before and after taking potassium pills she wakes up every morning with a "pounding" heart and has been told several times that her Dystolic levels are higher than average. Please advise.

## 2013-07-30 ENCOUNTER — Encounter: Payer: Self-pay | Admitting: Family Medicine

## 2013-08-05 ENCOUNTER — Encounter: Payer: 59 | Admitting: Family Medicine

## 2013-09-19 ENCOUNTER — Other Ambulatory Visit: Payer: Self-pay

## 2013-09-25 ENCOUNTER — Telehealth: Payer: Self-pay

## 2013-09-25 NOTE — Telephone Encounter (Addendum)
Left message for call back  identifiable  Medication and allergies: reviewed and updated  90 day supply/mail order: na Local pharmacy: [facility]   Immunizations due:  Declines flu vaccine  A/P:   No changes to FH or PSH Tdap--04/2006 Pap--07/2011--requesting for this visit [facility]--07/08/2013---neg  To Discuss with Provider: Not at this time

## 2013-09-27 ENCOUNTER — Other Ambulatory Visit (HOSPITAL_COMMUNITY)
Admission: RE | Admit: 2013-09-27 | Discharge: 2013-09-27 | Disposition: A | Discharge status: Home / Self Care | Payer: 59 | Source: Ambulatory Visit | Attending: Family Medicine | Admitting: Family Medicine

## 2013-09-27 ENCOUNTER — Encounter: Payer: Self-pay | Admitting: Family Medicine

## 2013-09-27 ENCOUNTER — Ambulatory Visit (INDEPENDENT_AMBULATORY_CARE_PROVIDER_SITE_OTHER): Payer: 59 | Admitting: Family Medicine

## 2013-09-27 VITALS — BP 114/76 | HR 67 | Temp 97.9°F | Ht 65.5 in | Wt 163.4 lb

## 2013-09-27 DIAGNOSIS — E039 Hypothyroidism, unspecified: Secondary | ICD-10-CM

## 2013-09-27 DIAGNOSIS — M47812 Spondylosis without myelopathy or radiculopathy, cervical region: Secondary | ICD-10-CM

## 2013-09-27 DIAGNOSIS — Z1151 Encounter for screening for human papillomavirus (HPV): Secondary | ICD-10-CM | POA: Insufficient documentation

## 2013-09-27 DIAGNOSIS — Z0000 Encounter for general adult medical examination without abnormal findings: Secondary | ICD-10-CM

## 2013-09-27 DIAGNOSIS — Z124 Encounter for screening for malignant neoplasm of cervix: Secondary | ICD-10-CM

## 2013-09-27 DIAGNOSIS — F430 Acute stress reaction: Secondary | ICD-10-CM

## 2013-09-27 DIAGNOSIS — Z01419 Encounter for gynecological examination (general) (routine) without abnormal findings: Secondary | ICD-10-CM | POA: Insufficient documentation

## 2013-09-27 DIAGNOSIS — M47815 Spondylosis without myelopathy or radiculopathy, thoracolumbar region: Secondary | ICD-10-CM

## 2013-09-27 DIAGNOSIS — R609 Edema, unspecified: Secondary | ICD-10-CM

## 2013-09-27 DIAGNOSIS — M479 Spondylosis, unspecified: Secondary | ICD-10-CM

## 2013-09-27 LAB — CBC WITH DIFFERENTIAL/PLATELET
Basophils Relative: 0.5 % (ref 0.0–3.0)
Eosinophils Relative: 1.2 % (ref 0.0–5.0)
HCT: 42 % (ref 36.0–46.0)
Lymphs Abs: 1.4 10*3/uL (ref 0.7–4.0)
Monocytes Relative: 9.3 % (ref 3.0–12.0)
Neutrophils Relative %: 68 % (ref 43.0–77.0)
Platelets: 193 10*3/uL (ref 150.0–400.0)
RBC: 4.46 Mil/uL (ref 3.87–5.11)
RDW: 13.3 % (ref 11.5–14.6)
WBC: 6.9 10*3/uL (ref 4.5–10.5)

## 2013-09-27 LAB — POCT URINALYSIS DIPSTICK
Blood, UA: NEGATIVE
Glucose, UA: NEGATIVE
Leukocytes, UA: NEGATIVE
Nitrite, UA: NEGATIVE
Urobilinogen, UA: 0.2

## 2013-09-27 LAB — HEPATIC FUNCTION PANEL
ALT: 14 U/L (ref 0–35)
AST: 17 U/L (ref 0–37)
Albumin: 4.5 g/dL (ref 3.5–5.2)
Alkaline Phosphatase: 43 U/L (ref 39–117)
Bilirubin, Direct: 0.1 mg/dL (ref 0.0–0.3)
Total Bilirubin: 0.9 mg/dL (ref 0.3–1.2)

## 2013-09-27 LAB — BASIC METABOLIC PANEL
BUN: 14 mg/dL (ref 6–23)
Calcium: 9.6 mg/dL (ref 8.4–10.5)
Creatinine, Ser: 0.6 mg/dL (ref 0.4–1.2)
GFR: 107.92 mL/min (ref >60.00)
Potassium: 3.4 mEq/L — ABNORMAL LOW (ref 3.5–5.1)

## 2013-09-27 LAB — LIPID PANEL
HDL: 64.9 mg/dL (ref >39.00)
LDL Cholesterol: 81 mg/dL (ref 0–99)
Total CHOL/HDL Ratio: 2

## 2013-09-27 LAB — TSH: TSH: 0.77 u[IU]/mL (ref 0.35–5.50)

## 2013-09-27 MED ORDER — ALPRAZOLAM 0.25 MG PO TABS: 0.5000 mg | ORAL_TABLET | Freq: Three times a day (TID) | ORAL | Status: DC | PRN

## 2013-09-27 MED ORDER — MELOXICAM 15 MG PO TABS: ORAL_TABLET | ORAL | Status: DC

## 2013-09-27 NOTE — Progress Notes (Signed)
Pre visit review using our clinic review tool, if applicable. No additional management support is needed unless otherwise documented below in the visit note. 

## 2013-09-27 NOTE — Assessment & Plan Note (Signed)
Check labs 

## 2013-09-27 NOTE — Progress Notes (Signed)
Subjective:     Gina Pineda is a 41 y.o. female and is here for a comprehensive physical exam. The patient reports problems - still having issues with numbness in R arm and neck pain.  History   Social History  . Marital Status: Married    Spouse Name: N/A    Number of Children: 2  . Years of Education: N/A   Occupational History  . massage therapist    Social History Main Topics  . Smoking status: Never Smoker   . Smokeless tobacco: Never Used  . Alcohol Use: Yes     Comment: 4-5 drinks/ month  . Drug Use: No  . Sexual Activity: Yes    Partners: Male   Other Topics Concern  . Not on file   Social History Narrative   GETS REG EXERCISE         Health Maintenance  Topic Date Due  . Influenza Vaccine  09/27/2014  . Mammogram  07/08/2014  . Tetanus/tdap  04/25/2016  . Pap Smear  09/27/2016    The following portions of the patient's history were reviewed and updated as appropriate:  She  has a past medical history of Thyroid disease. She  does not have any pertinent problems on file. She  has past surgical history that includes Cesarean section; Nasal septum surgery; Intrauterine device insertion; and Nasal septum surgery. Her family history includes Breast cancer in an other family member; Cancer (age of onset: 76) in her maternal grandmother; Diabetes in her maternal grandfather; Hyperlipidemia in her father; Hypertension in her father. She  reports that she has never smoked. She has never used smokeless tobacco. She reports that she drinks alcohol. She reports that she does not use illicit drugs. She has a current medication list which includes the following prescription(s): alprazolam, hydrochlorothiazide, levonorgestrel, meloxicam, and potassium chloride sa. Current Outpatient Prescriptions on File Prior to Visit  Medication Sig Dispense Refill  . hydrochlorothiazide (HYDRODIURIL) 25 MG tablet TAKE 1 TABLET (25 MG TOTAL) BY MOUTH DAILY.  30 tablet  10  .  levonorgestrel (MIRENA) 20 MCG/24HR IUD 1 each by Intrauterine route once.        . potassium chloride SA (K-DUR,KLOR-CON) 20 MEQ tablet Take 1 tablet (20 mEq total) by mouth daily.  30 tablet  2   No current facility-administered medications on file prior to visit.   She has No Known Allergies..  Review of Systems Review of Systems  Constitutional: Negative for activity change, appetite change and fatigue.  HENT: Negative for hearing loss, congestion, tinnitus and ear discharge.  dentist q55m Eyes: Negative for visual disturbance (see optho q2y -- vision corrected to 20/20 with glasses).  Respiratory: Negative for cough, chest tightness and shortness of breath.   Cardiovascular: Negative for chest pain, palpitations and leg swelling.  Gastrointestinal: Negative for abdominal pain, diarrhea, constipation and abdominal distention.  Genitourinary: Negative for urgency, frequency, decreased urine volume and difficulty urinating.  Musculoskeletal: Negative for back pain, arthralgias and gait problem.  Skin: Negative for color change, pallor and rash.  Neurological: Negative for dizziness, light-headedness, numbness and headaches.  Hematological: Negative for adenopathy. Does not bruise/bleed easily.  Psychiatric/Behavioral: Negative for suicidal ideas, confusion, sleep disturbance, self-injury, dysphoric mood, decreased concentration and agitation.       Objective:    BP 114/76  Pulse 67  Temp(Src) 97.9 F (36.6 C) (Tympanic)  Ht 5' 5.5" (1.664 m)  Wt 163 lb 6.4 oz (74.118 kg)  BMI 26.77 kg/m2  SpO2 99%  LMP 08/31/2013 General appearance: alert, cooperative, appears stated age and no distress Head: Normocephalic, without obvious abnormality, atraumatic Eyes: conjunctivae/corneas clear. PERRL, EOM's intact. Fundi benign. Ears: normal TM's and external ear canals both ears Nose: Nares normal. Septum midline. Mucosa normal. No drainage or sinus tenderness. Throat: lips, mucosa, and  tongue normal; teeth and gums normal Neck: no adenopathy, no carotid bruit, no JVD, supple, symmetrical, trachea midline and thyroid not enlarged, symmetric, no tenderness/mass/nodules Back: symmetric, no curvature. ROM normal. No CVA tenderness. Lungs: clear to auscultation bilaterally Breasts: normal appearance, no masses or tenderness Heart: regular rate and rhythm, S1, S2 normal, no murmur, click, rub or gallop Abdomen: soft, non-tender; bowel sounds normal; no masses,  no organomegaly Pelvic: cervix normal in appearance, external genitalia normal, no adnexal masses or tenderness, no cervical motion tenderness, rectovaginal septum normal, uterus normal size, shape, and consistency, vagina normal without discharge and pap done Extremities: extremities normal, atraumatic, no cyanosis or edema Pulses: 2+ and symmetric Skin: Skin color, texture, turgor normal. No rashes or lesions Lymph nodes: Cervical, supraclavicular, and axillary nodes normal. Neurologic: Alert and oriented X 3, normal strength and tone. Normal symmetric reflexes. Normal coordination and gait    Assessment:    Healthy female exam.      Plan:    ghm utd Check fasting labs See After Visit Summary for Counseling Recommendations

## 2013-09-27 NOTE — Patient Instructions (Signed)
Preventive Care for Adults, Female A healthy lifestyle and preventive care can promote health and wellness. Preventive health guidelines for women include the following key practices.  A routine yearly physical is a good way to check with your caregiver about your health and preventive screening. It is a chance to share any concerns and updates on your health, and to receive a thorough exam.  Visit your dentist for a routine exam and preventive care every 6 months. Brush your teeth twice a day and floss once a day. Good oral hygiene prevents tooth decay and gum disease.  The frequency of eye exams is based on your age, health, family medical history, use of contact lenses, and other factors. Follow your caregiver's recommendations for frequency of eye exams.  Eat a healthy diet. Foods like vegetables, fruits, whole grains, low-fat dairy products, and lean protein foods contain the nutrients you need without too many calories. Decrease your intake of foods high in solid fats, added sugars, and salt. Eat the right amount of calories for you.Get information about a proper diet from your caregiver, if necessary.  Regular physical exercise is one of the most important things you can do for your health. Most adults should get at least 150 minutes of moderate-intensity exercise (any activity that increases your heart rate and causes you to sweat) each week. In addition, most adults need muscle-strengthening exercises on 2 or more days a week.  Maintain a healthy weight. The body mass index (BMI) is a screening tool to identify possible weight problems. It provides an estimate of body fat based on height and weight. Your caregiver can help determine your BMI, and can help you achieve or maintain a healthy weight.For adults 20 years and older:  A BMI below 18.5 is considered underweight.  A BMI of 18.5 to 24.9 is normal.  A BMI of 25 to 29.9 is considered overweight.  A BMI of 30 and above is  considered obese.  Maintain normal blood lipids and cholesterol levels by exercising and minimizing your intake of saturated fat. Eat a balanced diet with plenty of fruit and vegetables. Blood tests for lipids and cholesterol should begin at age 20 and be repeated every 5 years. If your lipid or cholesterol levels are high, you are over 50, or you are at high risk for heart disease, you may need your cholesterol levels checked more frequently.Ongoing high lipid and cholesterol levels should be treated with medicines if diet and exercise are not effective.  If you smoke, find out from your caregiver how to quit. If you do not use tobacco, do not start.  Lung cancer screening is recommended for adults aged 55 80 years who are at high risk for developing lung cancer because of a history of smoking. Yearly low-dose computed tomography (CT) is recommended for people who have at least a 30-pack-year history of smoking and are a current smoker or have quit within the past 15 years. A pack year of smoking is smoking an average of 1 pack of cigarettes a day for 1 year (for example: 1 pack a day for 30 years or 2 packs a day for 15 years). Yearly screening should continue until the smoker has stopped smoking for at least 15 years. Yearly screening should also be stopped for people who develop a health problem that would prevent them from having lung cancer treatment.  If you are pregnant, do not drink alcohol. If you are breastfeeding, be very cautious about drinking alcohol. If you are   not pregnant and choose to drink alcohol, do not exceed 1 drink per day. One drink is considered to be 12 ounces (355 mL) of beer, 5 ounces (148 mL) of wine, or 1.5 ounces (44 mL) of liquor.  Avoid use of street drugs. Do not share needles with anyone. Ask for help if you need support or instructions about stopping the use of drugs.  High blood pressure causes heart disease and increases the risk of stroke. Your blood pressure  should be checked at least every 1 to 2 years. Ongoing high blood pressure should be treated with medicines if weight loss and exercise are not effective.  If you are 55 to 42 years old, ask your caregiver if you should take aspirin to prevent strokes.  Diabetes screening involves taking a blood sample to check your fasting blood sugar level. This should be done once every 3 years, after age 45, if you are within normal weight and without risk factors for diabetes. Testing should be considered at a younger age or be carried out more frequently if you are overweight and have at least 1 risk factor for diabetes.  Breast cancer screening is essential preventive care for women. You should practice "breast self-awareness." This means understanding the normal appearance and feel of your breasts and may include breast self-examination. Any changes detected, no matter how small, should be reported to a caregiver. Women in their 20s and 30s should have a clinical breast exam (CBE) by a caregiver as part of a regular health exam every 1 to 3 years. After age 40, women should have a CBE every year. Starting at age 40, women should consider having a mammography (breast X-ray test) every year. Women who have a family history of breast cancer should talk to their caregiver about genetic screening. Women at a high risk of breast cancer should talk to their caregivers about having magnetic resonance imaging (MRI) and a mammography every year.  Breast cancer gene (BRCA)-related cancer risk assessment is recommended for women who have family members with BRCA-related cancers. BRCA-related cancers include breast, ovarian, tubal, and peritoneal cancers. Having family members with these cancers may be associated with an increased risk for harmful changes (mutations) in the breast cancer genes BRCA1 and BRCA2. Results of the assessment will determine the need for genetic counseling and BRCA1 and BRCA2 testing.  The Pap test is  a screening test for cervical cancer. A Pap test can show cell changes on the cervix that might become cervical cancer if left untreated. A Pap test is a procedure in which cells are obtained and examined from the lower end of the uterus (cervix).  Women should have a Pap test starting at age 21.  Between ages 21 and 29, Pap tests should be repeated every 2 years.  Beginning at age 30, you should have a Pap test every 3 years as long as the past 3 Pap tests have been normal.  Some women have medical problems that increase the chance of getting cervical cancer. Talk to your caregiver about these problems. It is especially important to talk to your caregiver if a new problem develops soon after your last Pap test. In these cases, your caregiver may recommend more frequent screening and Pap tests.  The above recommendations are the same for women who have or have not gotten the vaccine for human papillomavirus (HPV).  If you had a hysterectomy for a problem that was not cancer or a condition that could lead to cancer, then   you no longer need Pap tests. Even if you no longer need a Pap test, a regular exam is a good idea to make sure no other problems are starting.  If you are between ages 65 and 70, and you have had normal Pap tests going back 10 years, you no longer need Pap tests. Even if you no longer need a Pap test, a regular exam is a good idea to make sure no other problems are starting.  If you have had past treatment for cervical cancer or a condition that could lead to cancer, you need Pap tests and screening for cancer for at least 20 years after your treatment.  If Pap tests have been discontinued, risk factors (such as a new sexual partner) need to be reassessed to determine if screening should be resumed.  The HPV test is an additional test that may be used for cervical cancer screening. The HPV test looks for the virus that can cause the cell changes on the cervix. The cells collected  during the Pap test can be tested for HPV. The HPV test could be used to screen women aged 30 years and older, and should be used in women of any age who have unclear Pap test results. After the age of 30, women should have HPV testing at the same frequency as a Pap test.  Colorectal cancer can be detected and often prevented. Most routine colorectal cancer screening begins at the age of 50 and continues through age 75. However, your caregiver may recommend screening at an earlier age if you have risk factors for colon cancer. On a yearly basis, your caregiver may provide home test kits to check for hidden blood in the stool. Use of a small camera at the end of a tube, to directly examine the colon (sigmoidoscopy or colonoscopy), can detect the earliest forms of colorectal cancer. Talk to your caregiver about this at age 50, when routine screening begins. Direct examination of the colon should be repeated every 5 to 10 years through age 75, unless early forms of pre-cancerous polyps or small growths are found.  Hepatitis C blood testing is recommended for all people born from 1945 through 1965 and any individual with known risks for hepatitis C.  Practice safe sex. Use condoms and avoid high-risk sexual practices to reduce the spread of sexually transmitted infections (STIs). STIs include gonorrhea, chlamydia, syphilis, trichomonas, herpes, HPV, and human immunodeficiency virus (HIV). Herpes, HIV, and HPV are viral illnesses that have no cure. They can result in disability, cancer, and death. Sexually active women aged 25 and younger should be checked for chlamydia. Older women with new or multiple partners should also be tested for chlamydia. Testing for other STIs is recommended if you are sexually active and at increased risk.  Osteoporosis is a disease in which the bones lose minerals and strength with aging. This can result in serious bone fractures. The risk of osteoporosis can be identified using a  bone density scan. Women ages 65 and over and women at risk for fractures or osteoporosis should discuss screening with their caregivers. Ask your caregiver whether you should take a calcium supplement or vitamin D to reduce the rate of osteoporosis.  Menopause can be associated with physical symptoms and risks. Hormone replacement therapy is available to decrease symptoms and risks. You should talk to your caregiver about whether hormone replacement therapy is right for you.  Use sunscreen. Apply sunscreen liberally and repeatedly throughout the day. You should seek shade   when your shadow is shorter than you. Protect yourself by wearing long sleeves, pants, a wide-brimmed hat, and sunglasses year round, whenever you are outdoors.  Once a month, do a whole body skin exam, using a mirror to look at the skin on your back. Notify your caregiver of new moles, moles that have irregular borders, moles that are larger than a pencil eraser, or moles that have changed in shape or color.  Stay current with required immunizations.  Influenza vaccine. All adults should be immunized every year.  Tetanus, diphtheria, and acellular pertussis (Td, Tdap) vaccine. Pregnant women should receive 1 dose of Tdap vaccine during each pregnancy. The dose should be obtained regardless of the length of time since the last dose. Immunization is preferred during the 27th to 36th week of gestation. An adult who has not previously received Tdap or who does not know her vaccine status should receive 1 dose of Tdap. This initial dose should be followed by tetanus and diphtheria toxoids (Td) booster doses every 10 years. Adults with an unknown or incomplete history of completing a 3-dose immunization series with Td-containing vaccines should begin or complete a primary immunization series including a Tdap dose. Adults should receive a Td booster every 10 years.  Varicella vaccine. An adult without evidence of immunity to varicella  should receive 2 doses or a second dose if she has previously received 1 dose. Pregnant females who do not have evidence of immunity should receive the first dose after pregnancy. This first dose should be obtained before leaving the health care facility. The second dose should be obtained 4 8 weeks after the first dose.  Human papillomavirus (HPV) vaccine. Females aged 13 26 years who have not received the vaccine previously should obtain the 3-dose series. The vaccine is not recommended for use in pregnant females. However, pregnancy testing is not needed before receiving a dose. If a female is found to be pregnant after receiving a dose, no treatment is needed. In that case, the remaining doses should be delayed until after the pregnancy. Immunization is recommended for any person with an immunocompromised condition through the age of 26 years if she did not get any or all doses earlier. During the 3-dose series, the second dose should be obtained 4 8 weeks after the first dose. The third dose should be obtained 24 weeks after the first dose and 16 weeks after the second dose.  Zoster vaccine. One dose is recommended for adults aged 60 years or older unless certain conditions are present.  Measles, mumps, and rubella (MMR) vaccine. Adults born before 1957 generally are considered immune to measles and mumps. Adults born in 1957 or later should have 1 or more doses of MMR vaccine unless there is a contraindication to the vaccine or there is laboratory evidence of immunity to each of the three diseases. A routine second dose of MMR vaccine should be obtained at least 28 days after the first dose for students attending postsecondary schools, health care workers, or international travelers. People who received inactivated measles vaccine or an unknown type of measles vaccine during 1963 1967 should receive 2 doses of MMR vaccine. People who received inactivated mumps vaccine or an unknown type of mumps vaccine  before 1979 and are at high risk for mumps infection should consider immunization with 2 doses of MMR vaccine. For females of childbearing age, rubella immunity should be determined. If there is no evidence of immunity, females who are not pregnant should be vaccinated. If there   is no evidence of immunity, females who are pregnant should delay immunization until after pregnancy. Unvaccinated health care workers born before 1957 who lack laboratory evidence of measles, mumps, or rubella immunity or laboratory confirmation of disease should consider measles and mumps immunization with 2 doses of MMR vaccine or rubella immunization with 1 dose of MMR vaccine.  Pneumococcal 13-valent conjugate (PCV13) vaccine. When indicated, a person who is uncertain of her immunization history and has no record of immunization should receive the PCV13 vaccine. An adult aged 19 years or older who has certain medical conditions and has not been previously immunized should receive 1 dose of PCV13 vaccine. This PCV13 should be followed with a dose of pneumococcal polysaccharide (PPSV23) vaccine. The PPSV23 vaccine dose should be obtained at least 8 weeks after the dose of PCV13 vaccine. An adult aged 19 years or older who has certain medical conditions and previously received 1 or more doses of PPSV23 vaccine should receive 1 dose of PCV13. The PCV13 vaccine dose should be obtained 1 or more years after the last PPSV23 vaccine dose.  Pneumococcal polysaccharide (PPSV23) vaccine. When PCV13 is also indicated, PCV13 should be obtained first. All adults aged 65 years and older should be immunized. An adult younger than age 65 years who has certain medical conditions should be immunized. Any person who resides in a nursing home or long-term care facility should be immunized. An adult smoker should be immunized. People with an immunocompromised condition and certain other conditions should receive both PCV13 and PPSV23 vaccines. People  with human immunodeficiency virus (HIV) infection should be immunized as soon as possible after diagnosis. Immunization during chemotherapy or radiation therapy should be avoided. Routine use of PPSV23 vaccine is not recommended for American Indians, [place] Natives, or people younger than 65 years unless there are medical conditions that require PPSV23 vaccine. When indicated, people who have unknown immunization and have no record of immunization should receive PPSV23 vaccine. One-time revaccination 5 years after the first dose of PPSV23 is recommended for people aged 19 64 years who have chronic kidney failure, nephrotic syndrome, asplenia, or immunocompromised conditions. People who received 1 2 doses of PPSV23 before age 65 years should receive another dose of PPSV23 vaccine at age 65 years or later if at least 5 years have passed since the previous dose. Doses of PPSV23 are not needed for people immunized with PPSV23 at or after age 65 years.  Meningococcal vaccine. Adults with asplenia or persistent complement component deficiencies should receive 2 doses of quadrivalent meningococcal conjugate (MenACWY-D) vaccine. The doses should be obtained at least 2 months apart. Microbiologists working with certain meningococcal bacteria, military recruits, people at risk during an outbreak, and people who travel to or live in countries with a high rate of meningitis should be immunized. A first-year college student up through age 21 years who is living in a residence hall should receive a dose if she did not receive a dose on or after her 16th birthday. Adults who have certain high-risk conditions should receive one or more doses of vaccine.  Hepatitis A vaccine. Adults who wish to be protected from this disease, have certain high-risk conditions, work with hepatitis A-infected animals, work in hepatitis A research labs, or travel to or work in countries with a high rate of hepatitis A should be immunized. Adults  who were previously unvaccinated and who anticipate close contact with an international adoptee during the first 60 days after arrival in the [place] from a country   with a high rate of hepatitis A should be immunized.  Hepatitis B vaccine. Adults who wish to be protected from this disease, have certain high-risk conditions, may be exposed to blood or other infectious body fluids, are household contacts or sex partners of hepatitis B positive people, are clients or workers in certain care facilities, or travel to or work in countries with a high rate of hepatitis B should be immunized.  Haemophilus influenzae type b (Hib) vaccine. A previously unvaccinated person with asplenia or sickle cell disease or having a scheduled splenectomy should receive 1 dose of Hib vaccine. Regardless of previous immunization, a recipient of a hematopoietic stem cell transplant should receive a 3-dose series 6 12 months after her successful transplant. Hib vaccine is not recommended for adults with HIV infection. Preventive Services / Frequency Ages 19 to 39  Blood pressure check.** / Every 1 to 2 years.  Lipid and cholesterol check.** / Every 5 years beginning at age 20.  Clinical breast exam.** / Every 3 years for women in their 20s and 30s.  BRCA-related cancer risk assessment.** / For women who have family members with a BRCA-related cancer (breast, ovarian, tubal, or peritoneal cancers).  Pap test.** / Every 2 years from ages 21 through 29. Every 3 years starting at age 30 through age 65 or 70 with a history of 3 consecutive normal Pap tests.  HPV screening.** / Every 3 years from ages 30 through ages 65 to 70 with a history of 3 consecutive normal Pap tests.  Hepatitis C blood test.** / For any individual with known risks for hepatitis C.  Skin self-exam. / Monthly.  Influenza vaccine. / Every year.  Tetanus, diphtheria, and acellular pertussis (Tdap, Td) vaccine.** / Consult your caregiver. Pregnant  women should receive 1 dose of Tdap vaccine during each pregnancy. 1 dose of Td every 10 years.  Varicella vaccine.** / Consult your caregiver. Pregnant females who do not have evidence of immunity should receive the first dose after pregnancy.  HPV vaccine. / 3 doses over 6 months, if 26 and younger. The vaccine is not recommended for use in pregnant females. However, pregnancy testing is not needed before receiving a dose.  Measles, mumps, rubella (MMR) vaccine.** / You need at least 1 dose of MMR if you were born in 1957 or later. You may also need a 2nd dose. For females of childbearing age, rubella immunity should be determined. If there is no evidence of immunity, females who are not pregnant should be vaccinated. If there is no evidence of immunity, females who are pregnant should delay immunization until after pregnancy.  Pneumococcal 13-valent conjugate (PCV13) vaccine.** / Consult your caregiver.  Pneumococcal polysaccharide (PPSV23) vaccine.** / 1 to 2 doses if you smoke cigarettes or if you have certain conditions.  Meningococcal vaccine.** / 1 dose if you are age 19 to 21 years and a first-year college student living in a residence hall, or have one of several medical conditions, you need to get vaccinated against meningococcal disease. You may also need additional booster doses.  Hepatitis A vaccine.** / Consult your caregiver.  Hepatitis B vaccine.** / Consult your caregiver.  Haemophilus influenzae type b (Hib) vaccine.** / Consult your caregiver. Ages 40 to 64  Blood pressure check.** / Every 1 to 2 years.  Lipid and cholesterol check.** / Every 5 years beginning at age 20.  Lung cancer screening. / Every year if you are aged 55 80 years and have a 30-pack-year history of smoking and   currently smoke or have quit within the past 15 years. Yearly screening is stopped once you have quit smoking for at least 15 years or develop a health problem that would prevent you from having  lung cancer treatment.  Clinical breast exam.** / Every year after age 40.  BRCA-related cancer risk assessment.** / For women who have family members with a BRCA-related cancer (breast, ovarian, tubal, or peritoneal cancers).  Mammogram.** / Every year beginning at age 40 and continuing for as long as you are in good health. Consult with your caregiver.  Pap test.** / Every 3 years starting at age 30 through age 65 or 70 with a history of 3 consecutive normal Pap tests.  HPV screening.** / Every 3 years from ages 30 through ages 65 to 70 with a history of 3 consecutive normal Pap tests.  Fecal occult blood test (FOBT) of stool. / Every year beginning at age 50 and continuing until age 75. You may not need to do this test if you get a colonoscopy every 10 years.  Flexible sigmoidoscopy or colonoscopy.** / Every 5 years for a flexible sigmoidoscopy or every 10 years for a colonoscopy beginning at age 50 and continuing until age 75.  Hepatitis C blood test.** / For all people born from 1945 through 1965 and any individual with known risks for hepatitis C.  Skin self-exam. / Monthly.  Influenza vaccine. / Every year.  Tetanus, diphtheria, and acellular pertussis (Tdap/Td) vaccine.** / Consult your caregiver. Pregnant women should receive 1 dose of Tdap vaccine during each pregnancy. 1 dose of Td every 10 years.  Varicella vaccine.** / Consult your caregiver. Pregnant females who do not have evidence of immunity should receive the first dose after pregnancy.  Zoster vaccine.** / 1 dose for adults aged 60 years or older.  Measles, mumps, rubella (MMR) vaccine.** / You need at least 1 dose of MMR if you were born in 1957 or later. You may also need a 2nd dose. For females of childbearing age, rubella immunity should be determined. If there is no evidence of immunity, females who are not pregnant should be vaccinated. If there is no evidence of immunity, females who are pregnant should delay  immunization until after pregnancy.  Pneumococcal 13-valent conjugate (PCV13) vaccine.** / Consult your caregiver.  Pneumococcal polysaccharide (PPSV23) vaccine.** / 1 to 2 doses if you smoke cigarettes or if you have certain conditions.  Meningococcal vaccine.** / Consult your caregiver.  Hepatitis A vaccine.** / Consult your caregiver.  Hepatitis B vaccine.** / Consult your caregiver.  Haemophilus influenzae type b (Hib) vaccine.** / Consult your caregiver. Ages 65 and over  Blood pressure check.** / Every 1 to 2 years.  Lipid and cholesterol check.** / Every 5 years beginning at age 20.  Lung cancer screening. / Every year if you are aged 55 80 years and have a 30-pack-year history of smoking and currently smoke or have quit within the past 15 years. Yearly screening is stopped once you have quit smoking for at least 15 years or develop a health problem that would prevent you from having lung cancer treatment.  Clinical breast exam.** / Every year after age 40.  BRCA-related cancer risk assessment.** / For women who have family members with a BRCA-related cancer (breast, ovarian, tubal, or peritoneal cancers).  Mammogram.** / Every year beginning at age 40 and continuing for as long as you are in good health. Consult with your caregiver.  Pap test.** / Every 3 years starting at age   30 through age 65 or 70 with a 3 consecutive normal Pap tests. Testing can be stopped between 65 and 70 with 3 consecutive normal Pap tests and no abnormal Pap or HPV tests in the past 10 years.  HPV screening.** / Every 3 years from ages 30 through ages 65 or 70 with a history of 3 consecutive normal Pap tests. Testing can be stopped between 65 and 70 with 3 consecutive normal Pap tests and no abnormal Pap or HPV tests in the past 10 years.  Fecal occult blood test (FOBT) of stool. / Every year beginning at age 50 and continuing until age 75. You may not need to do this test if you get a colonoscopy  every 10 years.  Flexible sigmoidoscopy or colonoscopy.** / Every 5 years for a flexible sigmoidoscopy or every 10 years for a colonoscopy beginning at age 50 and continuing until age 75.  Hepatitis C blood test.** / For all people born from 1945 through 1965 and any individual with known risks for hepatitis C.  Osteoporosis screening.** / A one-time screening for women ages 65 and over and women at risk for fractures or osteoporosis.  Skin self-exam. / Monthly.  Influenza vaccine. / Every year.  Tetanus, diphtheria, and acellular pertussis (Tdap/Td) vaccine.** / 1 dose of Td every 10 years.  Varicella vaccine.** / Consult your caregiver.  Zoster vaccine.** / 1 dose for adults aged 60 years or older.  Pneumococcal 13-valent conjugate (PCV13) vaccine.** / Consult your caregiver.  Pneumococcal polysaccharide (PPSV23) vaccine.** / 1 dose for all adults aged 65 years and older.  Meningococcal vaccine.** / Consult your caregiver.  Hepatitis A vaccine.** / Consult your caregiver.  Hepatitis B vaccine.** / Consult your caregiver.  Haemophilus influenzae type b (Hib) vaccine.** / Consult your caregiver. ** Family history and personal history of risk and conditions may change your caregiver's recommendations. Document Released: 12/27/2001 Document Revised: 02/25/2013 Document Reviewed: 03/28/2011 [employer] Patient Information 2014 [employer].  

## 2013-10-30 ENCOUNTER — Telehealth: Payer: Self-pay

## 2013-10-30 MED ORDER — POTASSIUM CHLORIDE CRYS ER 20 MEQ PO TBCR: 20.0000 meq | EXTENDED_RELEASE_TABLET | Freq: Every day | ORAL | Status: DC

## 2013-10-30 NOTE — Telephone Encounter (Signed)
Pap is normal Potassium is low-- increase K rich foods---ie OJ, bananas etc--- inc KCL to 2 a day Recheck 3 months 401.9 bmp    Discussed with patient since her result was not entered in my-chart and she is not taking the potassium, she will start at one daily and recheck her labs in 3 mos. Rx sent.    KP

## 2013-10-31 ENCOUNTER — Telehealth: Payer: Self-pay | Admitting: *Deleted

## 2013-10-31 NOTE — Telephone Encounter (Signed)
Inc K rich foods in diet.  con't with benadryl.  If worsening symptoms go to UC or er.  If rash persists we can see her tomorrow

## 2013-10-31 NOTE — Telephone Encounter (Signed)
Patient called and stated that she believes she is having an allergic reaction to the potassium medication that she took last night. Patient states that her face is a little swollen, red and her ears lobes are itchy. Patient states that she has developed a rash on her face. Patient states that she called the pharmacy and she was told to take a benadryl. Advised patient that if she has difficulty breathing, tightness of the chest, swelling of the mouth, lips and tongue to make sure she seek medical attention. Advised patient not to take anymore of the medication. Please advise. SW

## 2013-10-31 NOTE — Telephone Encounter (Signed)
Per patient request detailed message left on voicemail

## 2013-12-04 ENCOUNTER — Encounter: Payer: Self-pay | Admitting: Family Medicine

## 2014-01-15 ENCOUNTER — Telehealth: Payer: Self-pay | Admitting: Family Medicine

## 2014-01-15 DIAGNOSIS — E785 Hyperlipidemia, unspecified: Secondary | ICD-10-CM

## 2014-01-15 NOTE — Telephone Encounter (Signed)
Patient called and stated that it was time for her 3 month lab apt. Patient made an apt for Friday to come in and do her labs. Please advise

## 2014-01-17 ENCOUNTER — Other Ambulatory Visit (INDEPENDENT_AMBULATORY_CARE_PROVIDER_SITE_OTHER): Payer: 59

## 2014-01-17 DIAGNOSIS — E785 Hyperlipidemia, unspecified: Secondary | ICD-10-CM

## 2014-01-17 LAB — BASIC METABOLIC PANEL
BUN: 18 mg/dL (ref 6–23)
CO2: 27 mEq/L (ref 19–32)
Calcium: 9.6 mg/dL (ref 8.4–10.5)
Chloride: 104 mEq/L (ref 96–112)
Creatinine, Ser: 0.7 mg/dL (ref 0.4–1.2)
GFR: 100.48 mL/min (ref >60.00)
GLUCOSE: 99 mg/dL (ref 70–99)
POTASSIUM: 3.6 meq/L (ref 3.5–5.1)
SODIUM: 137 meq/L (ref 135–145)

## 2014-01-28 ENCOUNTER — Ambulatory Visit (INDEPENDENT_AMBULATORY_CARE_PROVIDER_SITE_OTHER): Payer: 59 | Admitting: Family Medicine

## 2014-01-28 ENCOUNTER — Encounter: Payer: Self-pay | Admitting: Family Medicine

## 2014-01-28 VITALS — BP 114/70 | HR 74 | Temp 98.2°F | Wt 164.0 lb

## 2014-01-28 DIAGNOSIS — F411 Generalized anxiety disorder: Secondary | ICD-10-CM

## 2014-01-28 DIAGNOSIS — R002 Palpitations: Secondary | ICD-10-CM

## 2014-01-28 MED ORDER — ESCITALOPRAM OXALATE 10 MG PO TABS: 10.0000 mg | ORAL_TABLET | Freq: Every day | ORAL | Status: DC

## 2014-01-28 NOTE — Patient Instructions (Signed)

## 2014-01-28 NOTE — Progress Notes (Signed)
Patient ID: Gina Pineda, female   DOB: [birth year], 43 y.o.   MRN: [id number]   Subjective:    Patient ID: Gina Pineda, female    DOB: 1971/07/15, 43 y.o.   MRN: [id number] HPI Pt here f/u palpitations --  Endo recommended he see Cardio.  Pt admits to inc anxiety and stress and xanax helps.  No cp or sob.          Objective:    BP 114/70  Pulse 74  Temp(Src) 98.2 F (36.8 C) (Oral)  Wt 164 lb (74.39 kg)  SpO2 99% General appearance: alert, cooperative, appears stated age and no distress Neck: supple, symmetrical, trachea midline and thyroid not enlarged, symmetric, no tenderness/mass/nodules Lungs: clear to auscultation bilaterally Heart: S1, S2 normal Extremities: extremities normal, atraumatic, no cyanosis or edema Neurologic: Alert and oriented X 3, normal strength and tone. Normal symmetric reflexes. Normal coordination and gait Mental status: Alert, oriented, thought content appropriate + increased anxiety and stress-- xanax helps palpitations       Assessment & Plan:  1. Palpitations prob anxiety - 2D Echocardiogram without contrast; Future - Ambulatory referral to Cardiology - escitalopram (LEXAPRO) 10 MG tablet; Take 1 tablet (10 mg total) by mouth daily.  Dispense: 30 tablet; Refill: 5 - Cardiac event monitor; Future - EKG 12-Lead  2. Generalized anxiety disorder Start meds - escitalopram (LEXAPRO) 10 MG tablet; Take 1 tablet (10 mg total) by mouth daily.  Dispense: 30 tablet; Refill: 5

## 2014-01-28 NOTE — Progress Notes (Signed)
Pre visit review using our clinic review tool, if applicable. No additional management support is needed unless otherwise documented below in the visit note. 

## 2014-01-29 ENCOUNTER — Other Ambulatory Visit: Payer: Self-pay | Admitting: Family Medicine

## 2014-01-30 ENCOUNTER — Ambulatory Visit: Payer: 59 | Admitting: Interventional Cardiology

## 2014-02-03 ENCOUNTER — Encounter: Payer: Self-pay | Admitting: *Deleted

## 2014-02-03 ENCOUNTER — Ambulatory Visit (HOSPITAL_COMMUNITY): Payer: 59 | Attending: Family Medicine | Admitting: Cardiology

## 2014-02-03 ENCOUNTER — Encounter (INDEPENDENT_AMBULATORY_CARE_PROVIDER_SITE_OTHER): Payer: 59

## 2014-02-03 ENCOUNTER — Other Ambulatory Visit (HOSPITAL_COMMUNITY): Payer: Self-pay | Admitting: Family Medicine

## 2014-02-03 DIAGNOSIS — R002 Palpitations: Secondary | ICD-10-CM

## 2014-02-03 NOTE — Progress Notes (Signed)
Echo performed. 

## 2014-02-03 NOTE — Progress Notes (Signed)
Patient ID: Gina Pineda, female   DOB: [birth year], 43 y.o.   MRN: [id number] E-Cardio verite 30 day cardiac event monitor applied to patient.

## 2014-02-06 ENCOUNTER — Telehealth: Payer: Self-pay | Admitting: *Deleted

## 2014-02-06 NOTE — Telephone Encounter (Signed)
Message copied by Chilton Greathouse on Thu Feb 06, 2014  2:08 PM ------      Message from: Rosalita Chessman      Created: Mon Feb 03, 2014 12:30 PM       Mild murmur only      Refer to cardiology if symptoms persist ------

## 2014-02-06 NOTE — Telephone Encounter (Signed)
LMOVM to make aware that if murmur causes any problems to let us know and we could refer to cards.

## 2014-03-26 ENCOUNTER — Encounter: Payer: Self-pay | Admitting: Cardiology

## 2014-03-26 ENCOUNTER — Ambulatory Visit (INDEPENDENT_AMBULATORY_CARE_PROVIDER_SITE_OTHER): Payer: 59 | Admitting: Cardiology

## 2014-03-26 VITALS — BP 112/74 | HR 60 | Ht 66.0 in | Wt 170.0 lb

## 2014-03-26 DIAGNOSIS — E039 Hypothyroidism, unspecified: Secondary | ICD-10-CM

## 2014-03-26 DIAGNOSIS — R002 Palpitations: Secondary | ICD-10-CM | POA: Insufficient documentation

## 2014-03-26 NOTE — Assessment & Plan Note (Signed)
Patient presents for evaluation of palpitations. She is not consuming excessive amounts of alcohol or caffeine. Potassium normal. She sees an endocrinologist for her thyroid and apparently that has been unremarkable. Echocardiogram shows normal LV function. Her of that monitor showed sinus rhythm and she did have symptoms while wearing this. No arrhythmias have been identified. We discussed low-dose beta-blockade to help with her symptoms. I am hesitant to add this as her blood pressure runs low and she has occasional orthostatic symptoms. She would also prefer to avoid this. No further workup at this time.

## 2014-03-26 NOTE — Progress Notes (Signed)
     HPI: 43 year old female for evaluation of palpitations. Potassium in March 2015 normal. Echocardiogram in March of 2015 showed normal LV function, mild aortic insufficiency and mild tricuspid regurgitation. Event monitor in April of 2015 showed sinus rhythm. She does not typically have dyspnea on exertion, orthopnea, PND, pedal edema, chest pain and no history of syncope. She has had occasional pounding sensation in her chest for several years. These are typically associated with stressful times. She can feel dizzy. Her pounding sensation can last all day. Note she did have symptoms with the monitor in place.   Current Outpatient Prescriptions  Medication Sig Dispense Refill  . ALPRAZolam (XANAX) 0.25 MG tablet Take 2 tablets (0.5 mg total) by mouth 3 (three) times daily as needed for anxiety.  90 tablet  0  . hydrochlorothiazide (HYDRODIURIL) 25 MG tablet TAKE 1 TABLET (25 MG TOTAL) BY MOUTH DAILY.  30 tablet  10  . levonorgestrel (MIRENA) 20 MCG/24HR IUD 1 each by Intrauterine route once.        . meloxicam (MOBIC) 15 MG tablet Take 1/2-1 tablet by mouth daily as needed for pain       No current facility-administered medications for this visit.    Allergies  Allergen Reactions  . Potassium-Containing Compounds Swelling    Past Medical History  Diagnosis Date  . Thyroid disease     Hypothyroidism  . Anxiety     Past Surgical History  Procedure Laterality Date  . Cesarean section    . Nasal septum surgery    . Intrauterine device insertion      History   Social History  . Marital Status: Married    Spouse Name: N/A    Number of Children: 2  . Years of Education: N/A   Occupational History  . massage therapist    Social History Main Topics  . Smoking status: Never Smoker   . Smokeless tobacco: Never Used  . Alcohol Use: Yes     Comment: 4-5 drinks/ month  . Drug Use: No  . Sexual Activity: Yes    Partners: Male   Other Topics Concern  . Not on file    Social History Narrative   GETS REG EXERCISE          Family History  Problem Relation Age of Onset  . Breast cancer    . Diabetes Maternal Grandfather   . Hypertension Father   . Hyperlipidemia Father   . Cancer Maternal Grandmother 70    breast    ROS: no fevers or chills, productive cough, hemoptysis, dysphasia, odynophagia, melena, hematochezia, dysuria, hematuria, rash, seizure activity, orthopnea, PND, pedal edema, claudication. Remaining systems are negative.  Physical Exam:   Blood pressure 112/74, pulse 60, height 5\' 6"  (1.676 m), weight 170 lb (77.111 kg).  General:  Well developed/well nourished in NAD Skin warm/dry Patient not depressed No peripheral clubbing Back-normal HEENT-normal/normal eyelids Neck supple/normal carotid upstroke bilaterally; no bruits; no JVD; no thyromegaly chest - CTA/ normal expansion CV - RRR/normal S1 and S2; no rubs or gallops;  PMI nondisplaced, 1/6 systolic ejection murmur. No diastolic murmur. Abdomen -NT/ND, no HSM, no mass, + bowel sounds, no bruit 2+ femoral pulses, no bruits Ext-no edema, chords, 2+ DP Neuro-grossly nonfocal  ECG 01/28/2014-sinus bradycardia with no ST changes.

## 2014-03-26 NOTE — Assessment & Plan Note (Signed)
Management per endocrinology.

## 2014-03-26 NOTE — Patient Instructions (Signed)
Your physician recommends that you schedule a follow-up appointment in: as needed  

## 2014-04-03 ENCOUNTER — Encounter: Payer: Self-pay | Admitting: Cardiology

## 2014-06-13 ENCOUNTER — Other Ambulatory Visit: Payer: Self-pay | Admitting: Family Medicine

## 2014-08-29 ENCOUNTER — Other Ambulatory Visit: Payer: Self-pay

## 2015-01-08 ENCOUNTER — Telehealth: Payer: Self-pay | Admitting: Family Medicine

## 2015-01-08 MED ORDER — HYDROCHLOROTHIAZIDE 25 MG PO TABS: 25.0000 mg | ORAL_TABLET | Freq: Every day | ORAL | Status: DC

## 2015-01-08 NOTE — Telephone Encounter (Addendum)
This patient needs an apt with PCP. Schedule CPE if possible.      KP

## 2015-01-08 NOTE — Telephone Encounter (Signed)
Called PT LM with office number to schedule appt.

## 2015-01-08 NOTE — Telephone Encounter (Signed)
Caller name:Lyndal Markowicz Relationship to patient:self Can be reached:[phone] Pharmacy: [address]  Reason for call: PT needing RX refill of hydrochlorothiazide (HYDRODIURIL) 25 MG tablet [[id number]]

## 2015-02-10 ENCOUNTER — Telehealth: Payer: Self-pay | Admitting: Family Medicine

## 2015-02-10 NOTE — Telephone Encounter (Signed)
Pre visit letter sent  °

## 2015-03-02 ENCOUNTER — Encounter: Payer: Self-pay | Admitting: *Deleted

## 2015-03-02 ENCOUNTER — Telehealth: Payer: Self-pay | Admitting: *Deleted

## 2015-03-02 NOTE — Telephone Encounter (Signed)
Pre-Visit Call completed with patient and chart updated.   Pre-Visit Info documented in Specialty Comments under [employer].    

## 2015-03-03 ENCOUNTER — Ambulatory Visit (INDEPENDENT_AMBULATORY_CARE_PROVIDER_SITE_OTHER): Payer: 59 | Admitting: Family Medicine

## 2015-03-03 ENCOUNTER — Ambulatory Visit (HOSPITAL_BASED_OUTPATIENT_CLINIC_OR_DEPARTMENT_OTHER)
Admission: RE | Admit: 2015-03-03 | Discharge: 2015-03-03 | Disposition: A | Discharge status: Home / Self Care | Payer: 59 | Source: Ambulatory Visit | Attending: Family Medicine | Admitting: Family Medicine

## 2015-03-03 ENCOUNTER — Other Ambulatory Visit (HOSPITAL_COMMUNITY)
Admission: RE | Admit: 2015-03-03 | Discharge: 2015-03-03 | Disposition: A | Discharge status: Home / Self Care | Payer: 59 | Source: Ambulatory Visit | Attending: Family Medicine | Admitting: Family Medicine

## 2015-03-03 ENCOUNTER — Encounter: Payer: Self-pay | Admitting: Family Medicine

## 2015-03-03 VITALS — BP 116/74 | HR 62 | Temp 98.0°F | Ht 66.0 in | Wt 171.8 lb

## 2015-03-03 DIAGNOSIS — Z1151 Encounter for screening for human papillomavirus (HPV): Secondary | ICD-10-CM | POA: Diagnosis present

## 2015-03-03 DIAGNOSIS — F430 Acute stress reaction: Secondary | ICD-10-CM | POA: Diagnosis not present

## 2015-03-03 DIAGNOSIS — Z01419 Encounter for gynecological examination (general) (routine) without abnormal findings: Secondary | ICD-10-CM | POA: Diagnosis not present

## 2015-03-03 DIAGNOSIS — R609 Edema, unspecified: Secondary | ICD-10-CM

## 2015-03-03 DIAGNOSIS — Z124 Encounter for screening for malignant neoplasm of cervix: Secondary | ICD-10-CM | POA: Diagnosis not present

## 2015-03-03 DIAGNOSIS — Z0000 Encounter for general adult medical examination without abnormal findings: Secondary | ICD-10-CM

## 2015-03-03 DIAGNOSIS — Z1231 Encounter for screening mammogram for malignant neoplasm of breast: Secondary | ICD-10-CM | POA: Diagnosis not present

## 2015-03-03 MED ORDER — HYDROCHLOROTHIAZIDE 25 MG PO TABS: 25.0000 mg | ORAL_TABLET | Freq: Every day | ORAL | Status: DC

## 2015-03-03 MED ORDER — ALPRAZOLAM 0.25 MG PO TABS: 0.5000 mg | ORAL_TABLET | Freq: Three times a day (TID) | ORAL | Status: AC | PRN

## 2015-03-03 NOTE — Progress Notes (Signed)
Pre visit review using our clinic review tool, if applicable. No additional management support is needed unless otherwise documented below in the visit note. 

## 2015-03-03 NOTE — Patient Instructions (Addendum)
Preventive Care for Adults A healthy lifestyle and preventive care can promote health and wellness. Preventive health guidelines for women include the following key practices.  A routine yearly physical is a good way to check with your health care provider about your health and preventive screening. It is a chance to share any concerns and updates on your health and to receive a thorough exam.  Visit your dentist for a routine exam and preventive care every 6 months. Brush your teeth twice a day and floss once a day. Good oral hygiene prevents tooth decay and gum disease.  The frequency of eye exams is based on your age, health, family medical history, use of contact lenses, and other factors. Follow your health care provider's recommendations for frequency of eye exams.  Eat a healthy diet. Foods like vegetables, fruits, whole grains, low-fat dairy products, and lean protein foods contain the nutrients you need without too many calories. Decrease your intake of foods high in solid fats, added sugars, and salt. Eat the right amount of calories for you.Get information about a proper diet from your health care provider, if necessary.  Regular physical exercise is one of the most important things you can do for your health. Most adults should get at least 150 minutes of moderate-intensity exercise (any activity that increases your heart rate and causes you to sweat) each week. In addition, most adults need muscle-strengthening exercises on 2 or more days a week.  Maintain a healthy weight. The body mass index (BMI) is a screening tool to identify possible weight problems. It provides an estimate of body fat based on height and weight. Your health care provider can find your BMI and can help you achieve or maintain a healthy weight.For adults 20 years and older:  A BMI below 18.5 is considered underweight.  A BMI of 18.5 to 24.9 is normal.  A BMI of 25 to 29.9 is considered overweight.  A BMI of  30 and above is considered obese.  Maintain normal blood lipids and cholesterol levels by exercising and minimizing your intake of saturated fat. Eat a balanced diet with plenty of fruit and vegetables. Blood tests for lipids and cholesterol should begin at age 76 and be repeated every 5 years. If your lipid or cholesterol levels are high, you are over 50, or you are at high risk for heart disease, you may need your cholesterol levels checked more frequently.Ongoing high lipid and cholesterol levels should be treated with medicines if diet and exercise are not working.  If you smoke, find out from your health care provider how to quit. If you do not use tobacco, do not start.  Lung cancer screening is recommended for adults aged 22-80 years who are at high risk for developing lung cancer because of a history of smoking. A yearly low-dose CT scan of the lungs is recommended for people who have at least a 30-pack-year history of smoking and are a current smoker or have quit within the past 15 years. A pack year of smoking is smoking an average of 1 pack of cigarettes a day for 1 year (for example: 1 pack a day for 30 years or 2 packs a day for 15 years). Yearly screening should continue until the smoker has stopped smoking for at least 15 years. Yearly screening should be stopped for people who develop a health problem that would prevent them from having lung cancer treatment.  If you are pregnant, do not drink alcohol. If you are breastfeeding,  be very cautious about drinking alcohol. If you are not pregnant and choose to drink alcohol, do not have more than 1 drink per day. One drink is considered to be 12 ounces (355 mL) of beer, 5 ounces (148 mL) of wine, or 1.5 ounces (44 mL) of liquor.  Avoid use of street drugs. Do not share needles with anyone. Ask for help if you need support or instructions about stopping the use of drugs.  High blood pressure causes heart disease and increases the risk of  stroke. Your blood pressure should be checked at least every 1 to 2 years. Ongoing high blood pressure should be treated with medicines if weight loss and exercise do not work.  If you are 75-52 years old, ask your health care provider if you should take aspirin to prevent strokes.  Diabetes screening involves taking a blood sample to check your fasting blood sugar level. This should be done once every 3 years, after age 15, if you are within normal weight and without risk factors for diabetes. Testing should be considered at a younger age or be carried out more frequently if you are overweight and have at least 1 risk factor for diabetes.  Breast cancer screening is essential preventive care for women. You should practice "breast self-awareness." This means understanding the normal appearance and feel of your breasts and may include breast self-examination. Any changes detected, no matter how small, should be reported to a health care provider. Women in their 58s and 30s should have a clinical breast exam (CBE) by a health care provider as part of a regular health exam every 1 to 3 years. After age 16, women should have a CBE every year. Starting at age 53, women should consider having a mammogram (breast X-ray test) every year. Women who have a family history of breast cancer should talk to their health care provider about genetic screening. Women at a high risk of breast cancer should talk to their health care providers about having an MRI and a mammogram every year.  Breast cancer gene (BRCA)-related cancer risk assessment is recommended for women who have family members with BRCA-related cancers. BRCA-related cancers include breast, ovarian, tubal, and peritoneal cancers. Having family members with these cancers may be associated with an increased risk for harmful changes (mutations) in the breast cancer genes BRCA1 and BRCA2. Results of the assessment will determine the need for genetic counseling and  BRCA1 and BRCA2 testing.  Routine pelvic exams to screen for cancer are no longer recommended for nonpregnant women who are considered low risk for cancer of the pelvic organs (ovaries, uterus, and vagina) and who do not have symptoms. Ask your health care provider if a screening pelvic exam is right for you.  If you have had past treatment for cervical cancer or a condition that could lead to cancer, you need Pap tests and screening for cancer for at least 20 years after your treatment. If Pap tests have been discontinued, your risk factors (such as having a new sexual partner) need to be reassessed to determine if screening should be resumed. Some women have medical problems that increase the chance of getting cervical cancer. In these cases, your health care provider may recommend more frequent screening and Pap tests.  The HPV test is an additional test that may be used for cervical cancer screening. The HPV test looks for the virus that can cause the cell changes on the cervix. The cells collected during the Pap test can be  tested for HPV. The HPV test could be used to screen women aged 30 years and older, and should be used in women of any age who have unclear Pap test results. After the age of 30, women should have HPV testing at the same frequency as a Pap test.  Colorectal cancer can be detected and often prevented. Most routine colorectal cancer screening begins at the age of 50 years and continues through age 75 years. However, your health care provider may recommend screening at an earlier age if you have risk factors for colon cancer. On a yearly basis, your health care provider may provide home test kits to check for hidden blood in the stool. Use of a small camera at the end of a tube, to directly examine the colon (sigmoidoscopy or colonoscopy), can detect the earliest forms of colorectal cancer. Talk to your health care provider about this at age 50, when routine screening begins. Direct  exam of the colon should be repeated every 5-10 years through age 75 years, unless early forms of pre-cancerous polyps or small growths are found.  People who are at an increased risk for hepatitis B should be screened for this virus. You are considered at high risk for hepatitis B if:  You were born in a country where hepatitis B occurs often. Talk with your health care provider about which countries are considered high risk.  Your parents were born in a high-risk country and you have not received a shot to protect against hepatitis B (hepatitis B vaccine).  You have HIV or AIDS.  You use needles to inject street drugs.  You live with, or have sex with, someone who has hepatitis B.  You get hemodialysis treatment.  You take certain medicines for conditions like cancer, organ transplantation, and autoimmune conditions.  Hepatitis C blood testing is recommended for all people born from 1945 through 1965 and any individual with known risks for hepatitis C.  Practice safe sex. Use condoms and avoid high-risk sexual practices to reduce the spread of sexually transmitted infections (STIs). STIs include gonorrhea, chlamydia, syphilis, trichomonas, herpes, HPV, and human immunodeficiency virus (HIV). Herpes, HIV, and HPV are viral illnesses that have no cure. They can result in disability, cancer, and death.  You should be screened for sexually transmitted illnesses (STIs) including gonorrhea and chlamydia if:  You are sexually active and are younger than 24 years.  You are older than 24 years and your health care provider tells you that you are at risk for this type of infection.  Your sexual activity has changed since you were last screened and you are at an increased risk for chlamydia or gonorrhea. Ask your health care provider if you are at risk.  If you are at risk of being infected with HIV, it is recommended that you take a prescription medicine daily to prevent HIV infection. This is  called preexposure prophylaxis (PrEP). You are considered at risk if:  You are a heterosexual woman, are sexually active, and are at increased risk for HIV infection.  You take drugs by injection.  You are sexually active with a partner who has HIV.  Talk with your health care provider about whether you are at high risk of being infected with HIV. If you choose to begin PrEP, you should first be tested for HIV. You should then be tested every 3 months for as long as you are taking PrEP.  Osteoporosis is a disease in which the bones lose minerals and strength   with aging. This can result in serious bone fractures or breaks. The risk of osteoporosis can be identified using a bone density scan. Women ages 65 years and over and women at risk for fractures or osteoporosis should discuss screening with their health care providers. Ask your health care provider whether you should take a calcium supplement or vitamin D to reduce the rate of osteoporosis.  Menopause can be associated with physical symptoms and risks. Hormone replacement therapy is available to decrease symptoms and risks. You should talk to your health care provider about whether hormone replacement therapy is right for you.  Use sunscreen. Apply sunscreen liberally and repeatedly throughout the day. You should seek shade when your shadow is shorter than you. Protect yourself by wearing long sleeves, pants, a wide-brimmed hat, and sunglasses year round, whenever you are outdoors.  Once a month, do a whole body skin exam, using a mirror to look at the skin on your back. Tell your health care provider of new moles, moles that have irregular borders, moles that are larger than a pencil eraser, or moles that have changed in shape or color.  Stay current with required vaccines (immunizations).  Influenza vaccine. All adults should be immunized every year.  Tetanus, diphtheria, and acellular pertussis (Td, Tdap) vaccine. Pregnant women should  receive 1 dose of Tdap vaccine during each pregnancy. The dose should be obtained regardless of the length of time since the last dose. Immunization is preferred during the 27th-36th week of gestation. An adult who has not previously received Tdap or who does not know her vaccine status should receive 1 dose of Tdap. This initial dose should be followed by tetanus and diphtheria toxoids (Td) booster doses every 10 years. Adults with an unknown or incomplete history of completing a 3-dose immunization series with Td-containing vaccines should begin or complete a primary immunization series including a Tdap dose. Adults should receive a Td booster every 10 years.  Varicella vaccine. An adult without evidence of immunity to varicella should receive 2 doses or a second dose if she has previously received 1 dose. Pregnant females who do not have evidence of immunity should receive the first dose after pregnancy. This first dose should be obtained before leaving the health care facility. The second dose should be obtained 4-8 weeks after the first dose.  Human papillomavirus (HPV) vaccine. Females aged 13-26 years who have not received the vaccine previously should obtain the 3-dose series. The vaccine is not recommended for use in pregnant females. However, pregnancy testing is not needed before receiving a dose. If a female is found to be pregnant after receiving a dose, no treatment is needed. In that case, the remaining doses should be delayed until after the pregnancy. Immunization is recommended for any person with an immunocompromised condition through the age of 26 years if she did not get any or all doses earlier. During the 3-dose series, the second dose should be obtained 4-8 weeks after the first dose. The third dose should be obtained 24 weeks after the first dose and 16 weeks after the second dose.  Zoster vaccine. One dose is recommended for adults aged 60 years or older unless certain conditions are  present.  Measles, mumps, and rubella (MMR) vaccine. Adults born before 1957 generally are considered immune to measles and mumps. Adults born in 1957 or later should have 1 or more doses of MMR vaccine unless there is a contraindication to the vaccine or there is laboratory evidence of immunity to   each of the three diseases. A routine second dose of MMR vaccine should be obtained at least 28 days after the first dose for students attending postsecondary schools, health care workers, or international travelers. People who received inactivated measles vaccine or an unknown type of measles vaccine during 1963-1967 should receive 2 doses of MMR vaccine. People who received inactivated mumps vaccine or an unknown type of mumps vaccine before 1979 and are at high risk for mumps infection should consider immunization with 2 doses of MMR vaccine. For females of childbearing age, rubella immunity should be determined. If there is no evidence of immunity, females who are not pregnant should be vaccinated. If there is no evidence of immunity, females who are pregnant should delay immunization until after pregnancy. Unvaccinated health care workers born before 1957 who lack laboratory evidence of measles, mumps, or rubella immunity or laboratory confirmation of disease should consider measles and mumps immunization with 2 doses of MMR vaccine or rubella immunization with 1 dose of MMR vaccine.  Pneumococcal 13-valent conjugate (PCV13) vaccine. When indicated, a person who is uncertain of her immunization history and has no record of immunization should receive the PCV13 vaccine. An adult aged 19 years or older who has certain medical conditions and has not been previously immunized should receive 1 dose of PCV13 vaccine. This PCV13 should be followed with a dose of pneumococcal polysaccharide (PPSV23) vaccine. The PPSV23 vaccine dose should be obtained at least 8 weeks after the dose of PCV13 vaccine. An adult aged 19  years or older who has certain medical conditions and previously received 1 or more doses of PPSV23 vaccine should receive 1 dose of PCV13. The PCV13 vaccine dose should be obtained 1 or more years after the last PPSV23 vaccine dose.  Pneumococcal polysaccharide (PPSV23) vaccine. When PCV13 is also indicated, PCV13 should be obtained first. All adults aged 65 years and older should be immunized. An adult younger than age 65 years who has certain medical conditions should be immunized. Any person who resides in a nursing home or long-term care facility should be immunized. An adult smoker should be immunized. People with an immunocompromised condition and certain other conditions should receive both PCV13 and PPSV23 vaccines. People with human immunodeficiency virus (HIV) infection should be immunized as soon as possible after diagnosis. Immunization during chemotherapy or radiation therapy should be avoided. Routine use of PPSV23 vaccine is not recommended for American Indians, [place] Natives, or people younger than 65 years unless there are medical conditions that require PPSV23 vaccine. When indicated, people who have unknown immunization and have no record of immunization should receive PPSV23 vaccine. One-time revaccination 5 years after the first dose of PPSV23 is recommended for people aged 19-64 years who have chronic kidney failure, nephrotic syndrome, asplenia, or immunocompromised conditions. People who received 1-2 doses of PPSV23 before age 65 years should receive another dose of PPSV23 vaccine at age 65 years or later if at least 5 years have passed since the previous dose. Doses of PPSV23 are not needed for people immunized with PPSV23 at or after age 65 years.  Meningococcal vaccine. Adults with asplenia or persistent complement component deficiencies should receive 2 doses of quadrivalent meningococcal conjugate (MenACWY-D) vaccine. The doses should be obtained at least 2 months apart.  Microbiologists working with certain meningococcal bacteria, military recruits, people at risk during an outbreak, and people who travel to or live in countries with a high rate of meningitis should be immunized. A first-year college student up through age   21 years who is living in a residence hall should receive a dose if she did not receive a dose on or after her 16th birthday. Adults who have certain high-risk conditions should receive one or more doses of vaccine.  Hepatitis A vaccine. Adults who wish to be protected from this disease, have certain high-risk conditions, work with hepatitis A-infected animals, work in hepatitis A research labs, or travel to or work in countries with a high rate of hepatitis A should be immunized. Adults who were previously unvaccinated and who anticipate close contact with an international adoptee during the first 60 days after arrival in the [place] from a country with a high rate of hepatitis A should be immunized.  Hepatitis B vaccine. Adults who wish to be protected from this disease, have certain high-risk conditions, may be exposed to blood or other infectious body fluids, are household contacts or sex partners of hepatitis B positive people, are clients or workers in certain care facilities, or travel to or work in countries with a high rate of hepatitis B should be immunized.  Haemophilus influenzae type b (Hib) vaccine. A previously unvaccinated person with asplenia or sickle cell disease or having a scheduled splenectomy should receive 1 dose of Hib vaccine. Regardless of previous immunization, a recipient of a hematopoietic stem cell transplant should receive a 3-dose series 6-12 months after her successful transplant. Hib vaccine is not recommended for adults with HIV infection. Preventive Services / Frequency Ages 64 to 68 years  Blood pressure check.** / Every 1 to 2 years.  Lipid and cholesterol check.** / Every 5 years beginning at age  22.  Clinical breast exam.** / Every 3 years for women in their 88s and 53s.  BRCA-related cancer risk assessment.** / For women who have family members with a BRCA-related cancer (breast, ovarian, tubal, or peritoneal cancers).  Pap test.** / Every 2 years from ages 90 through 51. Every 3 years starting at age 21 through age 56 or 3 with a history of 3 consecutive normal Pap tests.  HPV screening.** / Every 3 years from ages 24 through ages 1 to 46 with a history of 3 consecutive normal Pap tests.  Hepatitis C blood test.** / For any individual with known risks for hepatitis C.  Skin self-exam. / Monthly.  Influenza vaccine. / Every year.  Tetanus, diphtheria, and acellular pertussis (Tdap, Td) vaccine.** / Consult your health care provider. Pregnant women should receive 1 dose of Tdap vaccine during each pregnancy. 1 dose of Td every 10 years.  Varicella vaccine.** / Consult your health care provider. Pregnant females who do not have evidence of immunity should receive the first dose after pregnancy.  HPV vaccine. / 3 doses over 6 months, if 72 and younger. The vaccine is not recommended for use in pregnant females. However, pregnancy testing is not needed before receiving a dose.  Measles, mumps, rubella (MMR) vaccine.** / You need at least 1 dose of MMR if you were born in 1957 or later. You may also need a 2nd dose. For females of childbearing age, rubella immunity should be determined. If there is no evidence of immunity, females who are not pregnant should be vaccinated. If there is no evidence of immunity, females who are pregnant should delay immunization until after pregnancy.  Pneumococcal 13-valent conjugate (PCV13) vaccine.** / Consult your health care provider.  Pneumococcal polysaccharide (PPSV23) vaccine.** / 1 to 2 doses if you smoke cigarettes or if you have certain conditions.  Meningococcal vaccine.** /  1 dose if you are age 19 to 21 years and a first-year college  student living in a residence hall, or have one of several medical conditions, you need to get vaccinated against meningococcal disease. You may also need additional booster doses.  Hepatitis A vaccine.** / Consult your health care provider.  Hepatitis B vaccine.** / Consult your health care provider.  Haemophilus influenzae type b (Hib) vaccine.** / Consult your health care provider. Ages 40 to 64 years  Blood pressure check.** / Every 1 to 2 years.  Lipid and cholesterol check.** / Every 5 years beginning at age 20 years.  Lung cancer screening. / Every year if you are aged 55-80 years and have a 30-pack-year history of smoking and currently smoke or have quit within the past 15 years. Yearly screening is stopped once you have quit smoking for at least 15 years or develop a health problem that would prevent you from having lung cancer treatment.  Clinical breast exam.** / Every year after age 40 years.  BRCA-related cancer risk assessment.** / For women who have family members with a BRCA-related cancer (breast, ovarian, tubal, or peritoneal cancers).  Mammogram.** / Every year beginning at age 40 years and continuing for as long as you are in good health. Consult with your health care provider.  Pap test.** / Every 3 years starting at age 30 years through age 65 or 70 years with a history of 3 consecutive normal Pap tests.  HPV screening.** / Every 3 years from ages 30 years through ages 65 to 70 years with a history of 3 consecutive normal Pap tests.  Fecal occult blood test (FOBT) of stool. / Every year beginning at age 50 years and continuing until age 75 years. You may not need to do this test if you get a colonoscopy every 10 years.  Flexible sigmoidoscopy or colonoscopy.** / Every 5 years for a flexible sigmoidoscopy or every 10 years for a colonoscopy beginning at age 50 years and continuing until age 75 years.  Hepatitis C blood test.** / For all people born from 1945 through  1965 and any individual with known risks for hepatitis C.  Skin self-exam. / Monthly.  Influenza vaccine. / Every year.  Tetanus, diphtheria, and acellular pertussis (Tdap/Td) vaccine.** / Consult your health care provider. Pregnant women should receive 1 dose of Tdap vaccine during each pregnancy. 1 dose of Td every 10 years.  Varicella vaccine.** / Consult your health care provider. Pregnant females who do not have evidence of immunity should receive the first dose after pregnancy.  Zoster vaccine.** / 1 dose for adults aged 60 years or older.  Measles, mumps, rubella (MMR) vaccine.** / You need at least 1 dose of MMR if you were born in 1957 or later. You may also need a 2nd dose. For females of childbearing age, rubella immunity should be determined. If there is no evidence of immunity, females who are not pregnant should be vaccinated. If there is no evidence of immunity, females who are pregnant should delay immunization until after pregnancy.  Pneumococcal 13-valent conjugate (PCV13) vaccine.** / Consult your health care provider.  Pneumococcal polysaccharide (PPSV23) vaccine.** / 1 to 2 doses if you smoke cigarettes or if you have certain conditions.  Meningococcal vaccine.** / Consult your health care provider.  Hepatitis A vaccine.** / Consult your health care provider.  Hepatitis B vaccine.** / Consult your health care provider.  Haemophilus influenzae type b (Hib) vaccine.** / Consult your health care provider. Ages 65   years and over  Blood pressure check.** / Every 1 to 2 years.  Lipid and cholesterol check.** / Every 5 years beginning at age 20 years.  Lung cancer screening. / Every year if you are aged 55-80 years and have a 30-pack-year history of smoking and currently smoke or have quit within the past 15 years. Yearly screening is stopped once you have quit smoking for at least 15 years or develop a health problem that would prevent you from having lung cancer  treatment.  Clinical breast exam.** / Every year after age 40 years.  BRCA-related cancer risk assessment.** / For women who have family members with a BRCA-related cancer (breast, ovarian, tubal, or peritoneal cancers).  Mammogram.** / Every year beginning at age 40 years and continuing for as long as you are in good health. Consult with your health care provider.  Pap test.** / Every 3 years starting at age 30 years through age 65 or 70 years with 3 consecutive normal Pap tests. Testing can be stopped between 65 and 70 years with 3 consecutive normal Pap tests and no abnormal Pap or HPV tests in the past 10 years.  HPV screening.** / Every 3 years from ages 30 years through ages 65 or 70 years with a history of 3 consecutive normal Pap tests. Testing can be stopped between 65 and 70 years with 3 consecutive normal Pap tests and no abnormal Pap or HPV tests in the past 10 years.  Fecal occult blood test (FOBT) of stool. / Every year beginning at age 50 years and continuing until age 75 years. You may not need to do this test if you get a colonoscopy every 10 years.  Flexible sigmoidoscopy or colonoscopy.** / Every 5 years for a flexible sigmoidoscopy or every 10 years for a colonoscopy beginning at age 50 years and continuing until age 75 years.  Hepatitis C blood test.** / For all people born from 1945 through 1965 and any individual with known risks for hepatitis C.  Osteoporosis screening.** / A one-time screening for women ages 65 years and over and women at risk for fractures or osteoporosis.  Skin self-exam. / Monthly.  Influenza vaccine. / Every year.  Tetanus, diphtheria, and acellular pertussis (Tdap/Td) vaccine.** / 1 dose of Td every 10 years.  Varicella vaccine.** / Consult your health care provider.  Zoster vaccine.** / 1 dose for adults aged 60 years or older.  Pneumococcal 13-valent conjugate (PCV13) vaccine.** / Consult your health care provider.  Pneumococcal  polysaccharide (PPSV23) vaccine.** / 1 dose for all adults aged 65 years and older.  Meningococcal vaccine.** / Consult your health care provider.  Hepatitis A vaccine.** / Consult your health care provider.  Hepatitis B vaccine.** / Consult your health care provider.  Haemophilus influenzae type b (Hib) vaccine.** / Consult your health care provider. ** Family history and personal history of risk and conditions may change your health care provider's recommendations. Document Released: 12/27/2001 Document Revised: 03/17/2014 Document Reviewed: 03/28/2011 Exit[employer] Patient Information 2015 [employer]. This information is not intended to replace advice given to you by your health care provider. Make sure you discuss any questions you have with your health care provider.  

## 2015-03-03 NOTE — Progress Notes (Signed)
Subjective:     Gina Pineda is a 44 y.o. female and is here for a comprehensive physical exam. The patient reports no problems.  History   Social History  . Marital Status: Married    Spouse Name: N/A  . Number of Children: 2  . Years of Education: N/A   Occupational History  . massage therapist    Social History Main Topics  . Smoking status: Never Smoker   . Smokeless tobacco: Never Used  . Alcohol Use: Yes     Comment: 4-5 drinks/ month  . Drug Use: No  . Sexual Activity:    Partners: Male   Other Topics Concern  . Not on file   Social History Narrative   GETS REG EXERCISE         Health Maintenance  Topic Date Due  . MAMMOGRAM  07/08/2014  . HIV Screening  03/02/2016 (Originally [birth year]/1986)  . INFLUENZA VACCINE  06/15/2015  . TETANUS/TDAP  04/25/2016  . PAP SMEAR  09/27/2016    The following portions of the patient's history were reviewed and updated as appropriate:  She  has a past medical history of Thyroid disease and Anxiety. She  does not have any pertinent problems on file. She  has past surgical history that includes Cesarean section; Nasal septum surgery; and Intrauterine device insertion. Her family history includes Breast cancer in an other family member; Cancer (age of onset: 68) in her maternal grandmother; Diabetes in her maternal grandfather; Hyperlipidemia in her father; Hypertension in her father. She  reports that she has never smoked. She has never used smokeless tobacco. She reports that she drinks alcohol. She reports that she does not use illicit drugs. She has a current medication list which includes the following prescription(s): hydrochlorothiazide, levonorgestrel, and alprazolam. Current Outpatient Prescriptions on File Prior to Visit  Medication Sig Dispense Refill  . levonorgestrel (MIRENA) 20 MCG/24HR IUD 1 each by Intrauterine route once.       No current facility-administered medications on file prior to visit.   She is  allergic to potassium-containing compounds..  Review of Systems Review of Systems  Constitutional: Negative for activity change, appetite change and fatigue.  HENT: Negative for hearing loss, congestion, tinnitus and ear discharge.  dentist q31m Eyes: Negative for visual disturbance (see optho q1y -- vision corrected to 20/20 with glasses).  Respiratory: Negative for cough, chest tightness and shortness of breath.   Cardiovascular: Negative for chest pain, palpitations and leg swelling.  Gastrointestinal: Negative for abdominal pain, diarrhea, constipation and abdominal distention.  Genitourinary: Negative for urgency, frequency, decreased urine volume and difficulty urinating.  Musculoskeletal: Negative for back pain, arthralgias and gait problem.  Skin: Negative for color change, pallor and rash.  Neurological: Negative for dizziness, light-headedness, numbness and headaches.  Hematological: Negative for adenopathy. Does not bruise/bleed easily.  Psychiatric/Behavioral: Negative for suicidal ideas, confusion, sleep disturbance, self-injury, dysphoric mood, decreased concentration and agitation.    '  Objective:    BP 116/74 mmHg  Pulse 62  Temp(Src) 98 F (36.7 C) (Oral)  Ht 5\' 6"  (1.676 m)  Wt 171 lb 12.8 oz (77.928 kg)  BMI 27.74 kg/m2  SpO2 99%  LMP 02/09/2015 General appearance: alert, cooperative, appears stated age and no distress Head: Normocephalic, without obvious abnormality, atraumatic Eyes: conjunctivae/corneas clear. PERRL, EOM's intact. Fundi benign. Ears: normal TM's and external ear canals both ears Nose: Nares normal. Septum midline. Mucosa normal. No drainage or sinus tenderness. Throat: lips, mucosa, and tongue normal; teeth and  gums normal Neck: no adenopathy, no carotid bruit, no JVD, supple, symmetrical, trachea midline and thyroid not enlarged, symmetric, no tenderness/mass/nodules Back: symmetric, no curvature. ROM normal. No CVA tenderness. Lungs:  clear to auscultation bilaterally Breasts: normal appearance, no masses or tenderness Heart: regular rate and rhythm, S1, S2 normal, no murmur, click, rub or gallop Abdomen: soft, non-tender; bowel sounds normal; no masses,  no organomegaly Pelvic: cervix normal in appearance, external genitalia normal, no adnexal masses or tenderness, no cervical motion tenderness, rectovaginal septum normal, uterus normal size, shape, and consistency, vagina normal without discharge and pap done,  + IUD string Extremities: extremities normal, atraumatic, no cyanosis or edema Pulses: 2+ and symmetric Skin: Skin color, texture, turgor normal. No rashes or lesions Lymph nodes: Cervical, supraclavicular, and axillary nodes normal. Neurologic: Alert and oriented X 3, normal strength and tone. Normal symmetric reflexes. Normal coordination and gait Psych- no depression, no anxiety      Assessment:    Healthy female exam.      Plan:    ghm utd Check labs See After Visit Summary for Counseling Recommendations    1. Stress reaction   - ALPRAZolam (XANAX) 0.25 MG tablet; Take 2 tablets (0.5 mg total) by mouth 3 (three) times daily as needed for anxiety.  Dispense: 90 tablet; Refill: 0  2. Encounter for screening mammogram for breast cancer   - MM DIGITAL SCREENING BILATERAL; Future  3. Edema   - hydrochlorothiazide (HYDRODIURIL) 25 MG tablet; Take 1 tablet (25 mg total) by mouth daily.  Dispense: 90 tablet; Refill: 3  4. Preventative health care   - Basic metabolic panel - CBC with Differential/Platelet - Hepatic function panel - Lipid panel - POCT urinalysis dipstick - TSH  5. Screening for malignant neoplasm of cervix   - Cytology - PAP\

## 2015-03-04 LAB — BASIC METABOLIC PANEL
BUN: 11 mg/dL (ref 6–23)
CHLORIDE: 102 meq/L (ref 96–112)
CO2: 27 mEq/L (ref 19–32)
Calcium: 9.4 mg/dL (ref 8.4–10.5)
Creatinine, Ser: 0.67 mg/dL (ref 0.40–1.20)
GFR: 101.67 mL/min (ref >60.00)
GLUCOSE: 92 mg/dL (ref 70–99)
Potassium: 3.8 mEq/L (ref 3.5–5.1)
Sodium: 135 mEq/L (ref 135–145)

## 2015-03-04 LAB — HEPATIC FUNCTION PANEL
ALBUMIN: 4.6 g/dL (ref 3.5–5.2)
ALK PHOS: 46 U/L (ref 39–117)
ALT: 29 U/L (ref 0–35)
AST: 36 U/L (ref 0–37)
Bilirubin, Direct: 0.2 mg/dL (ref 0.0–0.3)
TOTAL PROTEIN: 7.6 g/dL (ref 6.0–8.3)
Total Bilirubin: 1 mg/dL (ref 0.2–1.2)

## 2015-03-04 LAB — CBC WITH DIFFERENTIAL/PLATELET
BASOS ABS: 0 10*3/uL (ref 0.0–0.1)
Basophils Relative: 0.2 % (ref 0.0–3.0)
EOS ABS: 0 10*3/uL (ref 0.0–0.7)
Eosinophils Relative: 0.5 % (ref 0.0–5.0)
HCT: 42.2 % (ref 36.0–46.0)
Hemoglobin: 14.4 g/dL (ref 12.0–15.0)
Lymphocytes Relative: 23.5 % (ref 12.0–46.0)
Lymphs Abs: 1.3 10*3/uL (ref 0.7–4.0)
MCHC: 34 g/dL (ref 30.0–36.0)
MCV: 93.3 fl (ref 78.0–100.0)
MONO ABS: 0.5 10*3/uL (ref 0.1–1.0)
Monocytes Relative: 9.6 % (ref 3.0–12.0)
NEUTROS PCT: 66.2 % (ref 43.0–77.0)
Neutro Abs: 3.6 10*3/uL (ref 1.4–7.7)
PLATELETS: 220 10*3/uL (ref 150.0–400.0)
RBC: 4.52 Mil/uL (ref 3.87–5.11)
RDW: 13.6 % (ref 11.5–15.5)
WBC: 5.4 10*3/uL (ref 4.0–10.5)

## 2015-03-04 LAB — TSH: TSH: 2.89 u[IU]/mL (ref 0.35–4.50)

## 2015-03-04 LAB — LIPID PANEL
CHOLESTEROL: 170 mg/dL (ref 0–200)
HDL: 65.3 mg/dL (ref >39.00)
LDL CALC: 96 mg/dL (ref 0–99)
NonHDL: 104.7
TRIGLYCERIDES: 43 mg/dL (ref 0.0–149.0)
Total CHOL/HDL Ratio: 3
VLDL: 8.6 mg/dL (ref 0.0–40.0)

## 2015-03-05 LAB — CYTOLOGY - PAP

## 2015-08-15 DIAGNOSIS — N83209 Unspecified ovarian cyst, unspecified side: Secondary | ICD-10-CM

## 2015-08-15 HISTORY — DX: Unspecified ovarian cyst, unspecified side: N83.209

## 2015-08-18 ENCOUNTER — Ambulatory Visit (HOSPITAL_BASED_OUTPATIENT_CLINIC_OR_DEPARTMENT_OTHER)
Admission: RE | Admit: 2015-08-18 | Discharge: 2015-08-18 | Disposition: A | Discharge status: Home / Self Care | Payer: 59 | Source: Ambulatory Visit | Attending: Physician Assistant | Admitting: Physician Assistant

## 2015-08-18 ENCOUNTER — Encounter: Payer: Self-pay | Admitting: Physician Assistant

## 2015-08-18 ENCOUNTER — Other Ambulatory Visit (HOSPITAL_COMMUNITY)
Admission: RE | Admit: 2015-08-18 | Discharge: 2015-08-18 | Disposition: A | Discharge status: Home / Self Care | Payer: 59 | Source: Ambulatory Visit | Attending: Physician Assistant | Admitting: Physician Assistant

## 2015-08-18 ENCOUNTER — Ambulatory Visit (INDEPENDENT_AMBULATORY_CARE_PROVIDER_SITE_OTHER): Payer: 59 | Admitting: Physician Assistant

## 2015-08-18 VITALS — BP 121/79 | HR 74 | Temp 97.8°F | Resp 16 | Ht 66.0 in | Wt 169.1 lb

## 2015-08-18 DIAGNOSIS — Z975 Presence of (intrauterine) contraceptive device: Secondary | ICD-10-CM | POA: Insufficient documentation

## 2015-08-18 DIAGNOSIS — N760 Acute vaginitis: Secondary | ICD-10-CM | POA: Diagnosis present

## 2015-08-18 DIAGNOSIS — R102 Pelvic and perineal pain: Secondary | ICD-10-CM

## 2015-08-18 DIAGNOSIS — R1031 Right lower quadrant pain: Secondary | ICD-10-CM | POA: Diagnosis present

## 2015-08-18 DIAGNOSIS — N83291 Other ovarian cyst, right side: Secondary | ICD-10-CM | POA: Insufficient documentation

## 2015-08-18 DIAGNOSIS — Z113 Encounter for screening for infections with a predominantly sexual mode of transmission: Secondary | ICD-10-CM | POA: Insufficient documentation

## 2015-08-18 DIAGNOSIS — R110 Nausea: Secondary | ICD-10-CM | POA: Diagnosis not present

## 2015-08-18 LAB — CBC WITH DIFFERENTIAL/PLATELET
Basophils Absolute: 0 10*3/uL (ref 0.0–0.1)
Basophils Relative: 0.1 % (ref 0.0–3.0)
Eosinophils Absolute: 0.1 10*3/uL (ref 0.0–0.7)
Eosinophils Relative: 0.5 % (ref 0.0–5.0)
HCT: 42.8 % (ref 36.0–46.0)
Hemoglobin: 14.4 g/dL (ref 12.0–15.0)
LYMPHS ABS: 1.6 10*3/uL (ref 0.7–4.0)
Lymphocytes Relative: 12.1 % (ref 12.0–46.0)
MCHC: 33.7 g/dL (ref 30.0–36.0)
MCV: 94.3 fl (ref 78.0–100.0)
MONOS PCT: 7.3 % (ref 3.0–12.0)
Monocytes Absolute: 0.9 10*3/uL (ref 0.1–1.0)
NEUTROS ABS: 10.3 10*3/uL — AB (ref 1.4–7.7)
NEUTROS PCT: 80 % — AB (ref 43.0–77.0)
Platelets: 231 10*3/uL (ref 150.0–400.0)
RBC: 4.53 Mil/uL (ref 3.87–5.11)
RDW: 13.5 % (ref 11.5–15.5)
WBC: 12.9 10*3/uL — ABNORMAL HIGH (ref 4.0–10.5)

## 2015-08-18 LAB — POCT URINALYSIS DIPSTICK
BILIRUBIN UA: NEGATIVE
Blood, UA: NEGATIVE
GLUCOSE UA: NEGATIVE
KETONES UA: NEGATIVE
LEUKOCYTES UA: NEGATIVE
Nitrite, UA: NEGATIVE
PH UA: 6.5
Spec Grav, UA: 1.015

## 2015-08-18 MED ORDER — MELOXICAM 15 MG PO TABS: 15.0000 mg | ORAL_TABLET | Freq: Every day | ORAL | Status: DC

## 2015-08-18 NOTE — Assessment & Plan Note (Signed)
Labs obtained revealing slight increased WBC at 12.9. US reveals complicated R ovarian cyst with free fluid indicating recent rupture. Rx Mobic for pain and inflammation. Supportive measures reviewed. Follow-up with GYN Thursday as scheduled. ER if anything acutely worsens.

## 2015-08-18 NOTE — Patient Instructions (Signed)
Please go to the lab for blood work. Then go to the first floor (Imaging Department) to schedule your Ultrasound. I will call with all results.  Stay hydrated. Take Mobic daily while pain is present to help with pain and inflammation. Use extra strength tylenol for breakthrough pain. Follow-up with your GYN on Thursday as scheduled.

## 2015-08-18 NOTE — Progress Notes (Signed)
Pre visit review using our clinic review tool, if applicable. No additional management support is needed unless otherwise documented below in the visit note/SLS  

## 2015-08-18 NOTE — Progress Notes (Signed)
Patient presents to clinic today c/o R pelvic discomfort x 1 day with mild nausea. Endorses now with some suprapubic discomfort. Denies fever, chills, malaise. Denies urinary urgency, frequency or hematuria.  Endorses good bowel movements without hematochezia, tenesmus or melena. Endorses hx of ovarian cysts. Has IUD in place [place]) but is 44 years old and scheduled to be removed Thursday. Denies vaginal pain, discharge or bleeding.  Past Medical History  Diagnosis Date  . Thyroid disease     Hypothyroidism  . Anxiety     Current Outpatient Prescriptions on File Prior to Visit  Medication Sig Dispense Refill  . ALPRAZolam (XANAX) 0.25 MG tablet Take 2 tablets (0.5 mg total) by mouth 3 (three) times daily as needed for anxiety. 90 tablet 0  . hydrochlorothiazide (HYDRODIURIL) 25 MG tablet Take 1 tablet (25 mg total) by mouth daily. 90 tablet 3  . levonorgestrel (MIRENA) 20 MCG/24HR IUD 1 each by Intrauterine route once.       No current facility-administered medications on file prior to visit.    Allergies  Allergen Reactions  . Potassium-Containing Compounds Swelling    Family History  Problem Relation Age of Onset  . Breast cancer    . Diabetes Maternal Grandfather   . Hypertension Father   . Hyperlipidemia Father   . Cancer Maternal Grandmother 3    breast    Social History   Social History  . Marital Status: Married    Spouse Name: N/A  . Number of Children: 2  . Years of Education: N/A   Occupational History  . massage therapist    Social History Main Topics  . Smoking status: Never Smoker   . Smokeless tobacco: Never Used  . Alcohol Use: Yes     Comment: 4-5 drinks/ month  . Drug Use: No  . Sexual Activity:    Partners: Male   Other Topics Concern  . None   Social History Narrative   GETS REG EXERCISE         Review of Systems - See HPI.  All other ROS are negative.  BP 121/79 mmHg  Pulse 74  Temp(Src) 97.8 F (36.6 C) (Oral)  Resp 16   Ht 5\' 6"  (1.676 m)  Wt 169 lb 2 oz (76.715 kg)  BMI 27.31 kg/m2  SpO2 100%  LMP 08/04/2015  Physical Exam  Constitutional: She is oriented to person, place, and time and well-developed, well-nourished, and in no distress.  HENT:  Head: Normocephalic and atraumatic.  Eyes: Conjunctivae are normal.  Neck: Neck supple.  Cardiovascular: Normal rate, normal heart sounds and intact distal pulses.   Pulmonary/Chest: Effort normal and breath sounds normal. No respiratory distress. She has no wheezes. She has no rales. She exhibits no tenderness.  Abdominal:  R pelvic pain with palpation. Also mild discomfort with palpation over suprapubic region.  Neurological: She is alert and oriented to person, place, and time.  Skin: Skin is warm and dry. No rash noted.  Nursing note and vitals reviewed.  Recent Results (from the past 2160 hour(s))  POCT urinalysis dipstick     Status: Normal   Collection Time: 08/18/15 11:19 AM  Result Value Ref Range   Color, UA straw    Clarity, UA clear    Glucose, UA negative    Bilirubin, UA negative    Ketones, UA negative    Spec Grav, UA 1.015    Blood, UA negative    pH, UA 6.5    Protein, UA  Urobilinogen, UA     Nitrite, UA negative    Leukocytes, UA Negative Negative  CBC w/Diff     Status: Abnormal   Collection Time: 08/18/15 11:23 AM  Result Value Ref Range   WBC 12.9 (H) 4.0 - 10.5 K/uL   RBC 4.53 3.87 - 5.11 Mil/uL   Hemoglobin 14.4 12.0 - 15.0 g/dL   HCT 42.8 36.0 - 46.0 %   MCV 94.3 78.0 - 100.0 fl   MCHC 33.7 30.0 - 36.0 g/dL   RDW 13.5 11.5 - 15.5 %   Platelets 231.0 150.0 - 400.0 K/uL   Neutrophils Relative % 80.0 (H) 43.0 - 77.0 %   Lymphocytes Relative 12.1 12.0 - 46.0 %   Monocytes Relative 7.3 3.0 - 12.0 %   Eosinophils Relative 0.5 0.0 - 5.0 %   Basophils Relative 0.1 0.0 - 3.0 %   Neutro Abs 10.3 (H) 1.4 - 7.7 K/uL   Lymphs Abs 1.6 0.7 - 4.0 K/uL   Monocytes Absolute 0.9 0.1 - 1.0 K/uL   Eosinophils Absolute 0.1 0.0 -  0.7 K/uL   Basophils Absolute 0.0 0.0 - 0.1 K/uL    Assessment/Plan: Pelvic pain in female Labs obtained revealing slight increased WBC at 12.9. US reveals complicated R ovarian cyst with free fluid indicating recent rupture. Rx Mobic for pain and inflammation. Supportive measures reviewed. Follow-up with GYN Thursday as scheduled. ER if anything acutely worsens.

## 2015-08-20 LAB — URINE CYTOLOGY ANCILLARY ONLY
Chlamydia: NEGATIVE
Neisseria Gonorrhea: NEGATIVE
TRICH (WINDOWPATH): NEGATIVE

## 2015-08-25 LAB — URINE CYTOLOGY ANCILLARY ONLY
BACTERIAL VAGINITIS: NEGATIVE
Candida vaginitis: NEGATIVE

## 2016-01-13 ENCOUNTER — Telehealth: Payer: Self-pay | Admitting: Family Medicine

## 2016-01-13 NOTE — Telephone Encounter (Signed)
LVM inquiring if patient received flu shot  °

## 2016-03-12 ENCOUNTER — Other Ambulatory Visit: Payer: Self-pay | Admitting: Family Medicine

## 2016-03-31 ENCOUNTER — Encounter: Payer: Self-pay | Admitting: Family Medicine

## 2016-03-31 ENCOUNTER — Ambulatory Visit (INDEPENDENT_AMBULATORY_CARE_PROVIDER_SITE_OTHER): Payer: 59 | Admitting: Family Medicine

## 2016-03-31 ENCOUNTER — Encounter: Payer: 59 | Admitting: Family Medicine

## 2016-03-31 VITALS — BP 146/90 | HR 61 | Temp 98.2°F | Ht 66.0 in | Wt 164.6 lb

## 2016-03-31 DIAGNOSIS — E039 Hypothyroidism, unspecified: Secondary | ICD-10-CM | POA: Diagnosis not present

## 2016-03-31 DIAGNOSIS — Z0000 Encounter for general adult medical examination without abnormal findings: Secondary | ICD-10-CM | POA: Diagnosis not present

## 2016-03-31 DIAGNOSIS — Z1239 Encounter for other screening for malignant neoplasm of breast: Secondary | ICD-10-CM

## 2016-03-31 DIAGNOSIS — R319 Hematuria, unspecified: Secondary | ICD-10-CM

## 2016-03-31 LAB — POCT URINALYSIS DIPSTICK
Bilirubin, UA: NEGATIVE
GLUCOSE UA: NEGATIVE
Leukocytes, UA: NEGATIVE
Nitrite, UA: NEGATIVE
PH UA: 6
Protein, UA: NEGATIVE
Spec Grav, UA: 1.015
UROBILINOGEN UA: 0.2

## 2016-03-31 NOTE — Progress Notes (Signed)
Pre visit review using our clinic review tool, if applicable. No additional management support is needed unless otherwise documented below in the visit note. 

## 2016-03-31 NOTE — Assessment & Plan Note (Signed)
Has been off meds for 4 years but palpitations have worsened Check labs

## 2016-03-31 NOTE — Patient Instructions (Signed)
Preventive Care for Adults, Female A healthy lifestyle and preventive care can promote health and wellness. Preventive health guidelines for women include the following key practices.  A routine yearly physical is a good way to check with your health care provider about your health and preventive screening. It is a chance to share any concerns and updates on your health and to receive a thorough exam.  Visit your dentist for a routine exam and preventive care every 6 months. Brush your teeth twice a day and floss once a day. Good oral hygiene prevents tooth decay and gum disease.  The frequency of eye exams is based on your age, health, family medical history, use of contact lenses, and other factors. Follow your health care provider's recommendations for frequency of eye exams.  Eat a healthy diet. Foods like vegetables, fruits, whole grains, low-fat dairy products, and lean protein foods contain the nutrients you need without too many calories. Decrease your intake of foods high in solid fats, added sugars, and salt. Eat the right amount of calories for you.Get information about a proper diet from your health care provider, if necessary.  Regular physical exercise is one of the most important things you can do for your health. Most adults should get at least 150 minutes of moderate-intensity exercise (any activity that increases your heart rate and causes you to sweat) each week. In addition, most adults need muscle-strengthening exercises on 2 or more days a week.  Maintain a healthy weight. The body mass index (BMI) is a screening tool to identify possible weight problems. It provides an estimate of body fat based on height and weight. Your health care provider can find your BMI and can help you achieve or maintain a healthy weight.For adults 20 years and older:  A BMI below 18.5 is considered underweight.  A BMI of 18.5 to 24.9 is normal.  A BMI of 25 to 29.9 is considered overweight.  A  BMI of 30 and above is considered obese.  Maintain normal blood lipids and cholesterol levels by exercising and minimizing your intake of saturated fat. Eat a balanced diet with plenty of fruit and vegetables. Blood tests for lipids and cholesterol should begin at age 45 and be repeated every 5 years. If your lipid or cholesterol levels are high, you are over 50, or you are at high risk for heart disease, you may need your cholesterol levels checked more frequently.Ongoing high lipid and cholesterol levels should be treated with medicines if diet and exercise are not working.  If you smoke, find out from your health care provider how to quit. If you do not use tobacco, do not start.  Lung cancer screening is recommended for adults aged 45-80 years who are at high risk for developing lung cancer because of a history of smoking. A yearly low-dose CT scan of the lungs is recommended for people who have at least a 30-pack-year history of smoking and are a current smoker or have quit within the past 15 years. A pack year of smoking is smoking an average of 1 pack of cigarettes a day for 1 year (for example: 1 pack a day for 30 years or 2 packs a day for 15 years). Yearly screening should continue until the smoker has stopped smoking for at least 15 years. Yearly screening should be stopped for people who develop a health problem that would prevent them from having lung cancer treatment.  If you are pregnant, do not drink alcohol. If you are  breastfeeding, be very cautious about drinking alcohol. If you are not pregnant and choose to drink alcohol, do not have more than 1 drink per day. One drink is considered to be 12 ounces (355 mL) of beer, 5 ounces (148 mL) of wine, or 1.5 ounces (44 mL) of liquor.  Avoid use of street drugs. Do not share needles with anyone. Ask for help if you need support or instructions about stopping the use of drugs.  High blood pressure causes heart disease and increases the risk  of stroke. Your blood pressure should be checked at least every 1 to 2 years. Ongoing high blood pressure should be treated with medicines if weight loss and exercise do not work.  If you are 55-79 years old, ask your health care provider if you should take aspirin to prevent strokes.  Diabetes screening is done by taking a blood sample to check your blood glucose level after you have not eaten for a certain period of time (fasting). If you are not overweight and you do not have risk factors for diabetes, you should be screened once every 3 years starting at age 45. If you are overweight or obese and you are 40-70 years of age, you should be screened for diabetes every year as part of your cardiovascular risk assessment.  Breast cancer screening is essential preventive care for women. You should practice "breast self-awareness." This means understanding the normal appearance and feel of your breasts and may include breast self-examination. Any changes detected, no matter how small, should be reported to a health care provider. Women in their 20s and 30s should have a clinical breast exam (CBE) by a health care provider as part of a regular health exam every 1 to 3 years. After age 40, women should have a CBE every year. Starting at age 40, women should consider having a mammogram (breast X-ray test) every year. Women who have a family history of breast cancer should talk to their health care provider about genetic screening. Women at a high risk of breast cancer should talk to their health care providers about having an MRI and a mammogram every year.  Breast cancer gene (BRCA)-related cancer risk assessment is recommended for women who have family members with BRCA-related cancers. BRCA-related cancers include breast, ovarian, tubal, and peritoneal cancers. Having family members with these cancers may be associated with an increased risk for harmful changes (mutations) in the breast cancer genes BRCA1 and  BRCA2. Results of the assessment will determine the need for genetic counseling and BRCA1 and BRCA2 testing.  Your health care provider may recommend that you be screened regularly for cancer of the pelvic organs (ovaries, uterus, and vagina). This screening involves a pelvic examination, including checking for microscopic changes to the surface of your cervix (Pap test). You may be encouraged to have this screening done every 3 years, beginning at age 21.  For women ages 30-65, health care providers may recommend pelvic exams and Pap testing every 3 years, or they may recommend the Pap and pelvic exam, combined with testing for human papilloma virus (HPV), every 5 years. Some types of HPV increase your risk of cervical cancer. Testing for HPV may also be done on women of any age with unclear Pap test results.  Other health care providers may not recommend any screening for nonpregnant women who are considered low risk for pelvic cancer and who do not have symptoms. Ask your health care provider if a screening pelvic exam is right for   you.  If you have had past treatment for cervical cancer or a condition that could lead to cancer, you need Pap tests and screening for cancer for at least 20 years after your treatment. If Pap tests have been discontinued, your risk factors (such as having a new sexual partner) need to be reassessed to determine if screening should resume. Some women have medical problems that increase the chance of getting cervical cancer. In these cases, your health care provider may recommend more frequent screening and Pap tests.  Colorectal cancer can be detected and often prevented. Most routine colorectal cancer screening begins at the age of 50 years and continues through age 75 years. However, your health care provider may recommend screening at an earlier age if you have risk factors for colon cancer. On a yearly basis, your health care provider may provide home test kits to check  for hidden blood in the stool. Use of a small camera at the end of a tube, to directly examine the colon (sigmoidoscopy or colonoscopy), can detect the earliest forms of colorectal cancer. Talk to your health care provider about this at age 50, when routine screening begins. Direct exam of the colon should be repeated every 5-10 years through age 75 years, unless early forms of precancerous polyps or small growths are found.  People who are at an increased risk for hepatitis B should be screened for this virus. You are considered at high risk for hepatitis B if:  You were born in a country where hepatitis B occurs often. Talk with your health care provider about which countries are considered high risk.  Your parents were born in a high-risk country and you have not received a shot to protect against hepatitis B (hepatitis B vaccine).  You have HIV or AIDS.  You use needles to inject street drugs.  You live with, or have sex with, someone who has hepatitis B.  You get hemodialysis treatment.  You take certain medicines for conditions like cancer, organ transplantation, and autoimmune conditions.  Hepatitis C blood testing is recommended for all people born from 1945 through 1965 and any individual with known risks for hepatitis C.  Practice safe sex. Use condoms and avoid high-risk sexual practices to reduce the spread of sexually transmitted infections (STIs). STIs include gonorrhea, chlamydia, syphilis, trichomonas, herpes, HPV, and human immunodeficiency virus (HIV). Herpes, HIV, and HPV are viral illnesses that have no cure. They can result in disability, cancer, and death.  You should be screened for sexually transmitted illnesses (STIs) including gonorrhea and chlamydia if:  You are sexually active and are younger than 24 years.  You are older than 24 years and your health care provider tells you that you are at risk for this type of infection.  Your sexual activity has changed  since you were last screened and you are at an increased risk for chlamydia or gonorrhea. Ask your health care provider if you are at risk.  If you are at risk of being infected with HIV, it is recommended that you take a prescription medicine daily to prevent HIV infection. This is called preexposure prophylaxis (PrEP). You are considered at risk if:  You are sexually active and do not regularly use condoms or know the HIV status of your partner(s).  You take drugs by injection.  You are sexually active with a partner who has HIV.  Talk with your health care provider about whether you are at high risk of being infected with HIV. If   you choose to begin PrEP, you should first be tested for HIV. You should then be tested every 3 months for as long as you are taking PrEP.  Osteoporosis is a disease in which the bones lose minerals and strength with aging. This can result in serious bone fractures or breaks. The risk of osteoporosis can be identified using a bone density scan. Women ages 67 years and over and women at risk for fractures or osteoporosis should discuss screening with their health care providers. Ask your health care provider whether you should take a calcium supplement or vitamin D to reduce the rate of osteoporosis.  Menopause can be associated with physical symptoms and risks. Hormone replacement therapy is available to decrease symptoms and risks. You should talk to your health care provider about whether hormone replacement therapy is right for you.  Use sunscreen. Apply sunscreen liberally and repeatedly throughout the day. You should seek shade when your shadow is shorter than you. Protect yourself by wearing long sleeves, pants, a wide-brimmed hat, and sunglasses year round, whenever you are outdoors.  Once a month, do a whole body skin exam, using a mirror to look at the skin on your back. Tell your health care provider of new moles, moles that have irregular borders, moles that  are larger than a pencil eraser, or moles that have changed in shape or color.  Stay current with required vaccines (immunizations).  Influenza vaccine. All adults should be immunized every year.  Tetanus, diphtheria, and acellular pertussis (Td, Tdap) vaccine. Pregnant women should receive 1 dose of Tdap vaccine during each pregnancy. The dose should be obtained regardless of the length of time since the last dose. Immunization is preferred during the 27th-36th week of gestation. An adult who has not previously received Tdap or who does not know her vaccine status should receive 1 dose of Tdap. This initial dose should be followed by tetanus and diphtheria toxoids (Td) booster doses every 10 years. Adults with an unknown or incomplete history of completing a 3-dose immunization series with Td-containing vaccines should begin or complete a primary immunization series including a Tdap dose. Adults should receive a Td booster every 10 years.  Varicella vaccine. An adult without evidence of immunity to varicella should receive 2 doses or a second dose if she has previously received 1 dose. Pregnant females who do not have evidence of immunity should receive the first dose after pregnancy. This first dose should be obtained before leaving the health care facility. The second dose should be obtained 4-8 weeks after the first dose.  Human papillomavirus (HPV) vaccine. Females aged 13-26 years who have not received the vaccine previously should obtain the 3-dose series. The vaccine is not recommended for use in pregnant females. However, pregnancy testing is not needed before receiving a dose. If a female is found to be pregnant after receiving a dose, no treatment is needed. In that case, the remaining doses should be delayed until after the pregnancy. Immunization is recommended for any person with an immunocompromised condition through the age of 61 years if she did not get any or all doses earlier. During the  3-dose series, the second dose should be obtained 4-8 weeks after the first dose. The third dose should be obtained 24 weeks after the first dose and 16 weeks after the second dose.  Zoster vaccine. One dose is recommended for adults aged 30 years or older unless certain conditions are present.  Measles, mumps, and rubella (MMR) vaccine. Adults born  before 1957 generally are considered immune to measles and mumps. Adults born in 1957 or later should have 1 or more doses of MMR vaccine unless there is a contraindication to the vaccine or there is laboratory evidence of immunity to each of the three diseases. A routine second dose of MMR vaccine should be obtained at least 28 days after the first dose for students attending postsecondary schools, health care workers, or international travelers. People who received inactivated measles vaccine or an unknown type of measles vaccine during 1963-1967 should receive 2 doses of MMR vaccine. People who received inactivated mumps vaccine or an unknown type of mumps vaccine before 1979 and are at high risk for mumps infection should consider immunization with 2 doses of MMR vaccine. For females of childbearing age, rubella immunity should be determined. If there is no evidence of immunity, females who are not pregnant should be vaccinated. If there is no evidence of immunity, females who are pregnant should delay immunization until after pregnancy. Unvaccinated health care workers born before 1957 who lack laboratory evidence of measles, mumps, or rubella immunity or laboratory confirmation of disease should consider measles and mumps immunization with 2 doses of MMR vaccine or rubella immunization with 1 dose of MMR vaccine.  Pneumococcal 13-valent conjugate (PCV13) vaccine. When indicated, a person who is uncertain of his immunization history and has no record of immunization should receive the PCV13 vaccine. All adults 65 years of age and older should receive this  vaccine. An adult aged 19 years or older who has certain medical conditions and has not been previously immunized should receive 1 dose of PCV13 vaccine. This PCV13 should be followed with a dose of pneumococcal polysaccharide (PPSV23) vaccine. Adults who are at high risk for pneumococcal disease should obtain the PPSV23 vaccine at least 8 weeks after the dose of PCV13 vaccine. Adults older than 45 years of age who have normal immune system function should obtain the PPSV23 vaccine dose at least 1 year after the dose of PCV13 vaccine.  Pneumococcal polysaccharide (PPSV23) vaccine. When PCV13 is also indicated, PCV13 should be obtained first. All adults aged 65 years and older should be immunized. An adult younger than age 65 years who has certain medical conditions should be immunized. Any person who resides in a nursing home or long-term care facility should be immunized. An adult smoker should be immunized. People with an immunocompromised condition and certain other conditions should receive both PCV13 and PPSV23 vaccines. People with human immunodeficiency virus (HIV) infection should be immunized as soon as possible after diagnosis. Immunization during chemotherapy or radiation therapy should be avoided. Routine use of PPSV23 vaccine is not recommended for American Indians, [place] Natives, or people younger than 65 years unless there are medical conditions that require PPSV23 vaccine. When indicated, people who have unknown immunization and have no record of immunization should receive PPSV23 vaccine. One-time revaccination 5 years after the first dose of PPSV23 is recommended for people aged 19-64 years who have chronic kidney failure, nephrotic syndrome, asplenia, or immunocompromised conditions. People who received 1-2 doses of PPSV23 before age 65 years should receive another dose of PPSV23 vaccine at age 65 years or later if at least 5 years have passed since the previous dose. Doses of PPSV23 are not  needed for people immunized with PPSV23 at or after age 65 years.  Meningococcal vaccine. Adults with asplenia or persistent complement component deficiencies should receive 2 doses of quadrivalent meningococcal conjugate (MenACWY-D) vaccine. The doses should be obtained   at least 2 months apart. Microbiologists working with certain meningococcal bacteria, [place] recruits, people at risk during an outbreak, and people who travel to or live in countries with a high rate of meningitis should be immunized. A first-year college student up through age 34 years who is living in a residence hall should receive a dose if she did not receive a dose on or after her 16th birthday. Adults who have certain high-risk conditions should receive one or more doses of vaccine.  Hepatitis A vaccine. Adults who wish to be protected from this disease, have certain high-risk conditions, work with hepatitis A-infected animals, work in hepatitis A research labs, or travel to or work in countries with a high rate of hepatitis A should be immunized. Adults who were previously unvaccinated and who anticipate close contact with an international adoptee during the first 60 days after arrival in the [place] from a country with a high rate of hepatitis A should be immunized.  Hepatitis B vaccine. Adults who wish to be protected from this disease, have certain high-risk conditions, may be exposed to blood or other infectious body fluids, are household contacts or sex partners of hepatitis B positive people, are clients or workers in certain care facilities, or travel to or work in countries with a high rate of hepatitis B should be immunized.  Haemophilus influenzae type b (Hib) vaccine. A previously unvaccinated person with asplenia or sickle cell disease or having a scheduled splenectomy should receive 1 dose of Hib vaccine. Regardless of previous immunization, a recipient of a hematopoietic stem cell transplant should receive a  3-dose series 6-12 months after her successful transplant. Hib vaccine is not recommended for adults with HIV infection. Preventive Services / Frequency Ages 35 to 4 years  Blood pressure check.** / Every 3-5 years.  Lipid and cholesterol check.** / Every 5 years beginning at age 60.  Clinical breast exam.** / Every 3 years for women in their 71s and 10s.  BRCA-related cancer risk assessment.** / For women who have family members with a BRCA-related cancer (breast, ovarian, tubal, or peritoneal cancers).  Pap test.** / Every 2 years from ages 76 through 26. Every 3 years starting at age 61 through age 76 or 93 with a history of 3 consecutive normal Pap tests.  HPV screening.** / Every 3 years from ages 37 through ages 60 to 51 with a history of 3 consecutive normal Pap tests.  Hepatitis C blood test.** / For any individual with known risks for hepatitis C.  Skin self-exam. / Monthly.  Influenza vaccine. / Every year.  Tetanus, diphtheria, and acellular pertussis (Tdap, Td) vaccine.** / Consult your health care provider. Pregnant women should receive 1 dose of Tdap vaccine during each pregnancy. 1 dose of Td every 10 years.  Varicella vaccine.** / Consult your health care provider. Pregnant females who do not have evidence of immunity should receive the first dose after pregnancy.  HPV vaccine. / 3 doses over 6 months, if 93 and younger. The vaccine is not recommended for use in pregnant females. However, pregnancy testing is not needed before receiving a dose.  Measles, mumps, rubella (MMR) vaccine.** / You need at least 1 dose of MMR if you were born in 1957 or later. You may also need a 2nd dose. For females of childbearing age, rubella immunity should be determined. If there is no evidence of immunity, females who are not pregnant should be vaccinated. If there is no evidence of immunity, females who are  pregnant should delay immunization until after pregnancy.  Pneumococcal  13-valent conjugate (PCV13) vaccine.** / Consult your health care provider.  Pneumococcal polysaccharide (PPSV23) vaccine.** / 1 to 2 doses if you smoke cigarettes or if you have certain conditions.  Meningococcal vaccine.** / 1 dose if you are age 68 to 8 years and a Market researcher living in a residence hall, or have one of several medical conditions, you need to get vaccinated against meningococcal disease. You may also need additional booster doses.  Hepatitis A vaccine.** / Consult your health care provider.  Hepatitis B vaccine.** / Consult your health care provider.  Haemophilus influenzae type b (Hib) vaccine.** / Consult your health care provider. Ages 7 to 53 years  Blood pressure check.** / Every year.  Lipid and cholesterol check.** / Every 5 years beginning at age 25 years.  Lung cancer screening. / Every year if you are aged 11-80 years and have a 30-pack-year history of smoking and currently smoke or have quit within the past 15 years. Yearly screening is stopped once you have quit smoking for at least 15 years or develop a health problem that would prevent you from having lung cancer treatment.  Clinical breast exam.** / Every year after age 48 years.  BRCA-related cancer risk assessment.** / For women who have family members with a BRCA-related cancer (breast, ovarian, tubal, or peritoneal cancers).  Mammogram.** / Every year beginning at age 41 years and continuing for as long as you are in good health. Consult with your health care provider.  Pap test.** / Every 3 years starting at age 65 years through age 37 or 70 years with a history of 3 consecutive normal Pap tests.  HPV screening.** / Every 3 years from ages 72 years through ages 60 to 40 years with a history of 3 consecutive normal Pap tests.  Fecal occult blood test (FOBT) of stool. / Every year beginning at age 21 years and continuing until age 5 years. You may not need to do this test if you get  a colonoscopy every 10 years.  Flexible sigmoidoscopy or colonoscopy.** / Every 5 years for a flexible sigmoidoscopy or every 10 years for a colonoscopy beginning at age 35 years and continuing until age 48 years.  Hepatitis C blood test.** / For all people born from [birth year] through 1965 and any individual with known risks for hepatitis C.  Skin self-exam. / Monthly.  Influenza vaccine. / Every year.  Tetanus, diphtheria, and acellular pertussis (Tdap/Td) vaccine.** / Consult your health care provider. Pregnant women should receive 1 dose of Tdap vaccine during each pregnancy. 1 dose of Td every 10 years.  Varicella vaccine.** / Consult your health care provider. Pregnant females who do not have evidence of immunity should receive the first dose after pregnancy.  Zoster vaccine.** / 1 dose for adults aged 30 years or older.  Measles, mumps, rubella (MMR) vaccine.** / You need at least 1 dose of MMR if you were born in 1957 or later. You may also need a second dose. For females of childbearing age, rubella immunity should be determined. If there is no evidence of immunity, females who are not pregnant should be vaccinated. If there is no evidence of immunity, females who are pregnant should delay immunization until after pregnancy.  Pneumococcal 13-valent conjugate (PCV13) vaccine.** / Consult your health care provider.  Pneumococcal polysaccharide (PPSV23) vaccine.** / 1 to 2 doses if you smoke cigarettes or if you have certain conditions.  Meningococcal vaccine.** /  Consult your health care provider.  Hepatitis A vaccine.** / Consult your health care provider.  Hepatitis B vaccine.** / Consult your health care provider.  Haemophilus influenzae type b (Hib) vaccine.** / Consult your health care provider. Ages 64 years and over  Blood pressure check.** / Every year.  Lipid and cholesterol check.** / Every 5 years beginning at age 23 years.  Lung cancer screening. / Every year if you  are aged 16-80 years and have a 30-pack-year history of smoking and currently smoke or have quit within the past 15 years. Yearly screening is stopped once you have quit smoking for at least 15 years or develop a health problem that would prevent you from having lung cancer treatment.  Clinical breast exam.** / Every year after age 74 years.  BRCA-related cancer risk assessment.** / For women who have family members with a BRCA-related cancer (breast, ovarian, tubal, or peritoneal cancers).  Mammogram.** / Every year beginning at age 44 years and continuing for as long as you are in good health. Consult with your health care provider.  Pap test.** / Every 3 years starting at age 58 years through age 22 or 39 years with 3 consecutive normal Pap tests. Testing can be stopped between 65 and 70 years with 3 consecutive normal Pap tests and no abnormal Pap or HPV tests in the past 10 years.  HPV screening.** / Every 3 years from ages 64 years through ages 70 or 61 years with a history of 3 consecutive normal Pap tests. Testing can be stopped between 65 and 70 years with 3 consecutive normal Pap tests and no abnormal Pap or HPV tests in the past 10 years.  Fecal occult blood test (FOBT) of stool. / Every year beginning at age 40 years and continuing until age 27 years. You may not need to do this test if you get a colonoscopy every 10 years.  Flexible sigmoidoscopy or colonoscopy.** / Every 5 years for a flexible sigmoidoscopy or every 10 years for a colonoscopy beginning at age 7 years and continuing until age 32 years.  Hepatitis C blood test.** / For all people born from [birth year] through 1965 and any individual with known risks for hepatitis C.  Osteoporosis screening.** / A one-time screening for women ages 30 years and over and women at risk for fractures or osteoporosis.  Skin self-exam. / Monthly.  Influenza vaccine. / Every year.  Tetanus, diphtheria, and acellular pertussis (Tdap/Td)  vaccine.** / 1 dose of Td every 10 years.  Varicella vaccine.** / Consult your health care provider.  Zoster vaccine.** / 1 dose for adults aged 35 years or older.  Pneumococcal 13-valent conjugate (PCV13) vaccine.** / Consult your health care provider.  Pneumococcal polysaccharide (PPSV23) vaccine.** / 1 dose for all adults aged 46 years and older.  Meningococcal vaccine.** / Consult your health care provider.  Hepatitis A vaccine.** / Consult your health care provider.  Hepatitis B vaccine.** / Consult your health care provider.  Haemophilus influenzae type b (Hib) vaccine.** / Consult your health care provider. ** Family history and personal history of risk and conditions may change your health care provider's recommendations.   This information is not intended to replace advice given to you by your health care provider. Make sure you discuss any questions you have with your health care provider.   Document Released: 12/27/2001 Document Revised: 11/21/2014 Document Reviewed: 03/28/2011 Elsevier Interactive Patient Education [employer].

## 2016-03-31 NOTE — Progress Notes (Signed)
Subjective:     Gina Pineda is a 45 y.o. female and is here for a comprehensive physical exam. The patient reports no problems.  Social History   Social History  . Marital Status: Married    Spouse Name: N/A  . Number of Children: 2  . Years of Education: N/A   Occupational History  . massage therapist    Social History Main Topics  . Smoking status: Never Smoker   . Smokeless tobacco: Never Used  . Alcohol Use: Yes     Comment: 4-5 drinks/ month  . Drug Use: No  . Sexual Activity:    Partners: Male   Other Topics Concern  . Not on file   Social History Narrative   GETS REG EXERCISE         Health Maintenance  Topic Date Due  . HIV Screening  04/09/1986  . MAMMOGRAM  03/02/2016  . TETANUS/TDAP  04/25/2016  . INFLUENZA VACCINE  06/14/2016  . PAP SMEAR  03/02/2018    The following portions of the patient's history were reviewed and updated as appropriate:  She  has a past medical history of Thyroid disease; Anxiety; and Ovarian cyst (08/2015). She  does not have any pertinent problems on file. She  has past surgical history that includes Cesarean section; Nasal septum surgery; and Intrauterine device insertion. Her family history includes Cancer (age of onset: 42) in her maternal grandmother; Diabetes in her maternal grandfather; Hyperlipidemia in her father; Hypertension in her father. She  reports that she has never smoked. She has never used smokeless tobacco. She reports that she drinks alcohol. She reports that she does not use illicit drugs. She has a current medication list which includes the following prescription(s): hydrochlorothiazide, ibuprofen, levonorgestrel, probiotic product, and turmeric. Current Outpatient Prescriptions on File Prior to Visit  Medication Sig Dispense Refill  . hydrochlorothiazide (HYDRODIURIL) 25 MG tablet TAKE 1 TABLET (25 MG TOTAL) BY MOUTH DAILY. 90 tablet 0  . levonorgestrel (MIRENA) 20 MCG/24HR IUD 1 each by Intrauterine  route once.      . Probiotic Product (PROBIOTIC & ACIDOPHILUS EX ST PO) Take by mouth daily.    . TURMERIC PO Take by mouth daily.     No current facility-administered medications on file prior to visit.   She is allergic to potassium-containing compounds..  Review of Systems Review of Systems  Constitutional: Negative for activity change, appetite change and fatigue.  HENT: Negative for hearing loss, congestion, tinnitus and ear discharge.  dentist q48m Eyes: Negative for visual disturbance (see optho q1y -- vision corrected to 20/20 with glasses).  Respiratory: Negative for cough, chest tightness and shortness of breath.   Cardiovascular: Negative for chest pain, palpitations and leg swelling.  Gastrointestinal: Negative for abdominal pain, diarrhea, constipation and abdominal distention.  Genitourinary: Negative for urgency, frequency, decreased urine volume and difficulty urinating.  Musculoskeletal: Negative for back pain, arthralgias and gait problem.  Skin: Negative for color change, pallor and rash.  Neurological: Negative for dizziness, light-headedness, numbness and headaches.  Hematological: Negative for adenopathy. Does not bruise/bleed easily.  Psychiatric/Behavioral: Negative for suicidal ideas, confusion, sleep disturbance, self-injury, dysphoric mood, decreased concentration and agitation.       Objective:    BP 146/90 mmHg  Pulse 61  Temp(Src) 98.2 F (36.8 C) (Oral)  Ht 5\' 6"  (1.676 m)  Wt 164 lb 9.6 oz (74.662 kg)  BMI 26.58 kg/m2  SpO2 99% General appearance: alert, cooperative, appears stated age and no distress Head: Normocephalic,  without obvious abnormality, atraumatic Eyes: conjunctivae/corneas clear. PERRL, EOM's intact. Fundi benign. Ears: normal TM's and external ear canals both ears Nose: Nares normal. Septum midline. Mucosa normal. No drainage or sinus tenderness. Throat: lips, mucosa, and tongue normal; teeth and gums normal Neck: no  adenopathy, no carotid bruit, no JVD, supple, symmetrical, trachea midline and thyroid not enlarged, symmetric, no tenderness/mass/nodules Back: symmetric, no curvature. ROM normal. No CVA tenderness. Lungs: clear to auscultation bilaterally Breasts: normal appearance, no masses or tenderness Heart: regular rate and rhythm, S1, S2 normal, no murmur, click, rub or gallop Abdomen: soft, non-tender; bowel sounds normal; no masses,  no organomegaly Pelvic: deferred Extremities: extremities normal, atraumatic, no cyanosis or edema Pulses: 2+ and symmetric Skin: Skin color, texture, turgor normal. No rashes or lesions Lymph nodes: Cervical, supraclavicular, and axillary nodes normal. Neurologic: Alert and oriented X 3, normal strength and tone. Normal symmetric reflexes. Normal coordination and gait    Assessment:    Healthy female exam.      Plan:  ghm utd Check labs   See After Visit Summary for Counseling Recommendations    1. Breast cancer screening   - MM Digital Screening; Future  2. Preventative health care   - Comprehensive metabolic panel - CBC with Differential/Platelet - Lipid panel - POCT urinalysis dipstick - TSH  3. Hypothyroidism, unspecified hypothyroidism type   - T3, free - T4, free  4. Hematuria   - Urine culture

## 2016-04-01 LAB — CBC WITH DIFFERENTIAL/PLATELET
Basophils Absolute: 0.1 10*3/uL (ref 0.0–0.1)
Basophils Relative: 0.9 % (ref 0.0–3.0)
EOS ABS: 0.1 10*3/uL (ref 0.0–0.7)
Eosinophils Relative: 0.9 % (ref 0.0–5.0)
HCT: 44.8 % (ref 36.0–46.0)
HEMOGLOBIN: 15.3 g/dL — AB (ref 12.0–15.0)
LYMPHS ABS: 1.8 10*3/uL (ref 0.7–4.0)
Lymphocytes Relative: 22.2 % (ref 12.0–46.0)
MCHC: 34.2 g/dL (ref 30.0–36.0)
MCV: 94.2 fl (ref 78.0–100.0)
MONO ABS: 0.8 10*3/uL (ref 0.1–1.0)
Monocytes Relative: 10.5 % (ref 3.0–12.0)
NEUTROS PCT: 65.5 % (ref 43.0–77.0)
Neutro Abs: 5.2 10*3/uL (ref 1.4–7.7)
Platelets: 227 10*3/uL (ref 150.0–400.0)
RBC: 4.76 Mil/uL (ref 3.87–5.11)
RDW: 13.3 % (ref 11.5–15.5)
WBC: 7.9 10*3/uL (ref 4.0–10.5)

## 2016-04-01 LAB — LIPID PANEL
CHOLESTEROL: 150 mg/dL (ref 0–200)
HDL: 48.7 mg/dL (ref >39.00)
LDL CALC: 93 mg/dL (ref 0–99)
NonHDL: 101.09
TRIGLYCERIDES: 39 mg/dL (ref 0.0–149.0)
Total CHOL/HDL Ratio: 3
VLDL: 7.8 mg/dL (ref 0.0–40.0)

## 2016-04-01 LAB — URINE CULTURE
COLONY COUNT: NO GROWTH
ORGANISM ID, BACTERIA: NO GROWTH

## 2016-04-01 LAB — COMPREHENSIVE METABOLIC PANEL
ALBUMIN: 4.9 g/dL (ref 3.5–5.2)
ALK PHOS: 49 U/L (ref 39–117)
ALT: 12 U/L (ref 0–35)
AST: 21 U/L (ref 0–37)
BUN: 14 mg/dL (ref 6–23)
CO2: 26 mEq/L (ref 19–32)
CREATININE: 0.67 mg/dL (ref 0.40–1.20)
Calcium: 9.6 mg/dL (ref 8.4–10.5)
Chloride: 99 mEq/L (ref 96–112)
GFR: 101.17 mL/min (ref >60.00)
GLUCOSE: 81 mg/dL (ref 70–99)
Potassium: 3.6 mEq/L (ref 3.5–5.1)
SODIUM: 135 meq/L (ref 135–145)
TOTAL PROTEIN: 7.7 g/dL (ref 6.0–8.3)
Total Bilirubin: 1.2 mg/dL (ref 0.2–1.2)

## 2016-04-01 LAB — T4, FREE: Free T4: 0.99 ng/dL (ref 0.60–1.60)

## 2016-04-01 LAB — T3, FREE: T3, Free: 3.5 pg/mL (ref 2.3–4.2)

## 2016-04-01 LAB — TSH: TSH: 2.08 u[IU]/mL (ref 0.35–4.50)

## 2016-04-15 ENCOUNTER — Ambulatory Visit (INDEPENDENT_AMBULATORY_CARE_PROVIDER_SITE_OTHER): Payer: 59 | Admitting: Family Medicine

## 2016-04-15 ENCOUNTER — Encounter: Payer: Self-pay | Admitting: Family Medicine

## 2016-04-15 VITALS — BP 128/90 | HR 70 | Temp 97.9°F | Ht 66.0 in | Wt 170.2 lb

## 2016-04-15 DIAGNOSIS — I10 Essential (primary) hypertension: Secondary | ICD-10-CM | POA: Insufficient documentation

## 2016-04-15 DIAGNOSIS — E063 Autoimmune thyroiditis: Secondary | ICD-10-CM | POA: Diagnosis not present

## 2016-04-15 MED ORDER — LEVOTHYROXINE SODIUM 50 MCG PO TABS: 50.0000 ug | ORAL_TABLET | Freq: Every day | ORAL | Status: DC

## 2016-04-15 NOTE — Assessment & Plan Note (Signed)
con't hctz Recheck 2 months

## 2016-04-15 NOTE — Progress Notes (Addendum)
Patient ID: Gina Pineda, female    DOB: Mar 19, 1971  Age: 45 y.o. MRN: [id number]    Subjective:  Subjective HPI Gina Pineda presents for f/u bp and is concerned about her thyroid.  Her endo had told her she would always be on thyroid meds and she stopped them.  She is wanting to go back on her low dose and see if this will help her palpatations , bp etc.   It helped in the past. She is still having palpations and had a cardiac work up that was neg.  Beta blocker was discussed but her bp was low at the time and side effects were a concern.    Review of Systems  Constitutional: Negative for diaphoresis, appetite change, fatigue and unexpected weight change.  Eyes: Negative for pain, redness and visual disturbance.  Respiratory: Negative for cough, chest tightness, shortness of breath and wheezing.   Cardiovascular: Positive for palpitations. Negative for chest pain and leg swelling.  Endocrine: Negative for cold intolerance, heat intolerance, polydipsia, polyphagia and polyuria.  Genitourinary: Negative for dysuria, frequency and difficulty urinating.  Neurological: Negative for dizziness, light-headedness, numbness and headaches.    History Past Medical History  Diagnosis Date  . Thyroid disease     Hypothyroidism  . Anxiety   . Ovarian cyst 08/2015    She has past surgical history that includes Cesarean section; Nasal septum surgery; and Intrauterine device insertion.   Her family history includes Cancer (age of onset: 36) in her maternal grandmother; Diabetes in her maternal grandfather; Hyperlipidemia in her father; Hypertension in her father.She reports that she has never smoked. She has never used smokeless tobacco. She reports that she drinks alcohol. She reports that she does not use illicit drugs.  Current Outpatient Prescriptions on File Prior to Visit  Medication Sig Dispense Refill  . hydrochlorothiazide (HYDRODIURIL) 25 MG tablet TAKE 1 TABLET (25 MG TOTAL) BY MOUTH  DAILY. 90 tablet 0  . ibuprofen (ADVIL,MOTRIN) 200 MG tablet Take 400 mg by mouth every 6 (six) hours as needed.    Marland Kitchen levonorgestrel (MIRENA) 20 MCG/24HR IUD 1 each by Intrauterine route once.      . Probiotic Product (PROBIOTIC & ACIDOPHILUS EX ST PO) Take by mouth daily.    . TURMERIC PO Take by mouth daily.     No current facility-administered medications on file prior to visit.     Objective:  Objective Physical Exam  Constitutional: She is oriented to person, place, and time. She appears well-developed and well-nourished.  HENT:  Head: Normocephalic and atraumatic.  Eyes: Conjunctivae and EOM are normal.  Neck: Normal range of motion. Neck supple. No JVD present. Carotid bruit is not present. No thyromegaly present.  Cardiovascular: Normal rate, regular rhythm and normal heart sounds.   No murmur heard. Pulmonary/Chest: Effort normal and breath sounds normal. No respiratory distress. She has no wheezes. She has no rales. She exhibits no tenderness.  Musculoskeletal: She exhibits no edema.  Neurological: She is alert and oriented to person, place, and time.  Psychiatric: She has a normal mood and affect.  Nursing note and vitals reviewed.  BP 128/90 mmHg  Pulse 70  Temp(Src) 97.9 F (36.6 C) (Oral)  Ht 5\' 6"  (1.676 m)  Wt 170 lb 3.2 oz (77.202 kg)  BMI 27.48 kg/m2  SpO2 98% Wt Readings from Last 3 Encounters:  04/15/16 170 lb 3.2 oz (77.202 kg)  03/31/16 164 lb 9.6 oz (74.662 kg)  08/18/15 169 lb 2 oz (76.715 kg)  Lab Results  Component Value Date   WBC 7.9 03/31/2016   HGB 15.3* 03/31/2016   HCT 44.8 03/31/2016   PLT 227.0 03/31/2016   GLUCOSE 81 03/31/2016   CHOL 150 03/31/2016   TRIG 39.0 03/31/2016   HDL 48.70 03/31/2016   LDLCALC 93 03/31/2016   ALT 12 03/31/2016   AST 21 03/31/2016   NA 135 03/31/2016   K 3.6 03/31/2016   CL 99 03/31/2016   CREATININE 0.67 03/31/2016   BUN 14 03/31/2016   CO2 26 03/31/2016   TSH 2.08 03/31/2016   HGBA1C 5.5  12/07/2009    US Transvaginal Non-ob  08/18/2015  CLINICAL DATA:  Right lower quadrant pain for 2 days with mild nausea. History of a C-section. Patient has an IUD. EXAM: TRANSABDOMINAL AND TRANSVAGINAL ULTRASOUND OF PELVIS TECHNIQUE: Both transabdominal and transvaginal ultrasound examinations of the pelvis were performed. Transabdominal technique was performed for global imaging of the pelvis including uterus, ovaries, adnexal regions, and pelvic cul-de-sac. It was necessary to proceed with endovaginal exam following the transabdominal exam to visualize the uterus, endometrium and ovaries to better advantage. COMPARISON:  None FINDINGS: Uterus Measurements: 9 x 4.5 x 4 cm. No uterine masses. Incidental note is made of cervical nabothian cysts. Cervix is closed. Endometrium Thickness: 5 mm. IUD appears well positioned. No endometrial mass or endometrial canal fluid. Right ovary Measurements: 3.3 x 2.5 x 2.4 cm. Complex cyst, likely an involuting corpus luteum, measuring 12 x 14 x 18 mm. No right adnexal mass. Left ovary Not visualized. Significant bowel gas noted in the left lower quadrant. No left adnexal mass. Other findings Small amount of cul-de-sac pelvic free fluid. IMPRESSION: 1. 18 mm complicated right ovarian cyst which may reflect an involuting corpus luteum. It could reflect a recently ruptured follicular cyst. There is a small amount pelvic free fluid. This potentially could be the source of this patient's discomfort. 2. Left ovary not visualized, likely obscured by the significant left lower quadrant bowel. 3. Normal uterus and endometrium. IUD well-positioned. No adnexal masses. Electronically Signed   By: Lajean Manes M.D.   On: 08/18/2015 15:25   US Pelvis Complete  08/18/2015  CLINICAL DATA:  Right lower quadrant pain for 2 days with mild nausea. History of a C-section. Patient has an IUD. EXAM: TRANSABDOMINAL AND TRANSVAGINAL ULTRASOUND OF PELVIS TECHNIQUE: Both transabdominal and  transvaginal ultrasound examinations of the pelvis were performed. Transabdominal technique was performed for global imaging of the pelvis including uterus, ovaries, adnexal regions, and pelvic cul-de-sac. It was necessary to proceed with endovaginal exam following the transabdominal exam to visualize the uterus, endometrium and ovaries to better advantage. COMPARISON:  None FINDINGS: Uterus Measurements: 9 x 4.5 x 4 cm. No uterine masses. Incidental note is made of cervical nabothian cysts. Cervix is closed. Endometrium Thickness: 5 mm. IUD appears well positioned. No endometrial mass or endometrial canal fluid. Right ovary Measurements: 3.3 x 2.5 x 2.4 cm. Complex cyst, likely an involuting corpus luteum, measuring 12 x 14 x 18 mm. No right adnexal mass. Left ovary Not visualized. Significant bowel gas noted in the left lower quadrant. No left adnexal mass. Other findings Small amount of cul-de-sac pelvic free fluid. IMPRESSION: 1. 18 mm complicated right ovarian cyst which may reflect an involuting corpus luteum. It could reflect a recently ruptured follicular cyst. There is a small amount pelvic free fluid. This potentially could be the source of this patient's discomfort. 2. Left ovary not visualized, likely obscured by the significant left lower  quadrant bowel. 3. Normal uterus and endometrium. IUD well-positioned. No adnexal masses. Electronically Signed   By: Lajean Manes M.D.   On: 08/18/2015 15:25     Assessment & Plan:  Plan I am having Ms. Riepe start on levothyroxine. I am also having her maintain her levonorgestrel, TURMERIC PO, Probiotic Product (PROBIOTIC & ACIDOPHILUS EX ST PO), hydrochlorothiazide, and ibuprofen.  Meds ordered this encounter  Medications  . levothyroxine (SYNTHROID, LEVOTHROID) 50 MCG tablet    Sig: Take 1 tablet (50 mcg total) by mouth daily.    Dispense:  90 tablet    Refill:  3    Problem List Items Addressed This Visit    HTN (hypertension)    con't  hctz Recheck 2 months       Other Visit Diagnoses    Hashimoto's thyroiditis    -  Primary    Relevant Medications    levothyroxine (SYNTHROID, LEVOTHROID) 50 MCG tablet     encouraged her to get back to exercising--- walking  Follow-up: Return in about 2 months (around 06/15/2016), or if symptoms worsen or fail to improve.  Ann Held, DO

## 2016-04-15 NOTE — Progress Notes (Signed)
Pre visit review using our clinic review tool, if applicable. No additional management support is needed unless otherwise documented below in the visit note. 

## 2016-04-15 NOTE — Patient Instructions (Signed)

## 2016-05-23 ENCOUNTER — Encounter: Payer: 59 | Admitting: Family Medicine

## 2016-06-14 ENCOUNTER — Other Ambulatory Visit: Payer: Self-pay | Admitting: Family Medicine

## 2016-06-17 ENCOUNTER — Ambulatory Visit: Payer: 59 | Admitting: Family Medicine

## 2016-06-19 ENCOUNTER — Encounter: Payer: Self-pay | Admitting: Family Medicine

## 2016-06-23 ENCOUNTER — Ambulatory Visit (HOSPITAL_BASED_OUTPATIENT_CLINIC_OR_DEPARTMENT_OTHER)
Admission: RE | Admit: 2016-06-23 | Discharge: 2016-06-23 | Disposition: A | Discharge status: Home / Self Care | Payer: 59 | Source: Ambulatory Visit | Attending: Family Medicine | Admitting: Family Medicine

## 2016-06-23 ENCOUNTER — Other Ambulatory Visit: Payer: Self-pay | Admitting: Family Medicine

## 2016-06-23 DIAGNOSIS — Z1239 Encounter for other screening for malignant neoplasm of breast: Secondary | ICD-10-CM | POA: Insufficient documentation

## 2016-06-23 DIAGNOSIS — Z1231 Encounter for screening mammogram for malignant neoplasm of breast: Secondary | ICD-10-CM | POA: Diagnosis not present

## 2016-08-08 ENCOUNTER — Ambulatory Visit: Payer: Self-pay | Admitting: Family Medicine

## 2016-09-05 ENCOUNTER — Other Ambulatory Visit: Payer: Self-pay | Admitting: Family Medicine

## 2016-09-05 DIAGNOSIS — Z8639 Personal history of other endocrine, nutritional and metabolic disease: Secondary | ICD-10-CM

## 2017-06-01 ENCOUNTER — Encounter: Payer: Self-pay | Admitting: Family Medicine

## 2017-06-01 ENCOUNTER — Other Ambulatory Visit: Payer: Self-pay | Admitting: Family Medicine

## 2017-06-01 ENCOUNTER — Ambulatory Visit (INDEPENDENT_AMBULATORY_CARE_PROVIDER_SITE_OTHER): Payer: 59 | Admitting: Family Medicine

## 2017-06-01 VITALS — BP 122/80 | HR 73 | Temp 97.8°F | Ht 66.0 in | Wt 178.1 lb

## 2017-06-01 DIAGNOSIS — Z23 Encounter for immunization: Secondary | ICD-10-CM

## 2017-06-01 DIAGNOSIS — Z0000 Encounter for general adult medical examination without abnormal findings: Secondary | ICD-10-CM | POA: Diagnosis not present

## 2017-06-01 DIAGNOSIS — E063 Autoimmune thyroiditis: Secondary | ICD-10-CM | POA: Diagnosis not present

## 2017-06-01 DIAGNOSIS — Z1231 Encounter for screening mammogram for malignant neoplasm of breast: Secondary | ICD-10-CM

## 2017-06-01 LAB — CBC WITH DIFFERENTIAL/PLATELET
Basophils Absolute: 0 10*3/uL (ref 0.0–0.1)
Basophils Relative: 0.9 % (ref 0.0–3.0)
Eosinophils Absolute: 0.1 10*3/uL (ref 0.0–0.7)
Eosinophils Relative: 1.5 % (ref 0.0–5.0)
HCT: 44.7 % (ref 36.0–46.0)
Hemoglobin: 14.8 g/dL (ref 12.0–15.0)
Lymphocytes Relative: 23.9 % (ref 12.0–46.0)
Lymphs Abs: 1.2 10*3/uL (ref 0.7–4.0)
MCHC: 33.2 g/dL (ref 30.0–36.0)
MCV: 98 fl (ref 78.0–100.0)
Monocytes Absolute: 0.4 10*3/uL (ref 0.1–1.0)
Monocytes Relative: 8 % (ref 3.0–12.0)
Neutro Abs: 3.2 10*3/uL (ref 1.4–7.7)
Neutrophils Relative %: 65.7 % (ref 43.0–77.0)
Platelets: 225 10*3/uL (ref 150.0–400.0)
RBC: 4.56 Mil/uL (ref 3.87–5.11)
RDW: 13.4 % (ref 11.5–15.5)
WBC: 4.9 10*3/uL (ref 4.0–10.5)

## 2017-06-01 LAB — COMPREHENSIVE METABOLIC PANEL
ALT: 11 U/L (ref 0–35)
AST: 15 U/L (ref 0–37)
Albumin: 4.4 g/dL (ref 3.5–5.2)
Alkaline Phosphatase: 51 U/L (ref 39–117)
BUN: 12 mg/dL (ref 6–23)
CO2: 23 mEq/L (ref 19–32)
Calcium: 9.7 mg/dL (ref 8.4–10.5)
Chloride: 106 mEq/L (ref 96–112)
Creatinine, Ser: 0.77 mg/dL (ref 0.40–1.20)
GFR: 85.72 mL/min (ref >60.00)
Glucose, Bld: 115 mg/dL — ABNORMAL HIGH (ref 70–99)
Potassium: 4.1 mEq/L (ref 3.5–5.1)
Sodium: 136 mEq/L (ref 135–145)
Total Bilirubin: 0.7 mg/dL (ref 0.2–1.2)
Total Protein: 7.2 g/dL (ref 6.0–8.3)

## 2017-06-01 LAB — POC URINALSYSI DIPSTICK (AUTOMATED)
Bilirubin, UA: NEGATIVE
Blood, UA: NEGATIVE
Glucose, UA: NEGATIVE
Ketones, UA: NEGATIVE
Leukocytes, UA: NEGATIVE
Nitrite, UA: NEGATIVE
Protein, UA: NEGATIVE
Spec Grav, UA: 1.02 (ref 1.010–1.025)
Urobilinogen, UA: 0.2 E.U./dL
pH, UA: 6 (ref 5.0–8.0)

## 2017-06-01 LAB — LIPID PANEL
Cholesterol: 143 mg/dL (ref 0–200)
HDL: 59.4 mg/dL (ref >39.00)
LDL Cholesterol: 77 mg/dL (ref 0–99)
NonHDL: 83.15
Total CHOL/HDL Ratio: 2
Triglycerides: 30 mg/dL (ref 0.0–149.0)
VLDL: 6 mg/dL (ref 0.0–40.0)

## 2017-06-01 LAB — T3, FREE: T3, Free: 3 pg/mL (ref 2.3–4.2)

## 2017-06-01 LAB — TSH: TSH: 1.7 u[IU]/mL (ref 0.35–4.50)

## 2017-06-01 LAB — T4, FREE: Free T4: 0.91 ng/dL (ref 0.60–1.60)

## 2017-06-01 NOTE — Progress Notes (Signed)
Subjective:     Gina Pineda is a 46 y.o. female and is here for a comprehensive physical exam. The patient reports no problems. Pt has been going to [facility] and with diet and supplements she has been feeling good.    Social History   Social History  . Marital status: Married    Spouse name: N/A  . Number of children: 2  . Years of education: N/A   Occupational History  . massage therapist Massage Therapist   Social History Main Topics  . Smoking status: Never Smoker  . Smokeless tobacco: Never Used  . Alcohol use Yes     Comment: 4-5 drinks/ month  . Drug use: No  . Sexual activity: Yes    Partners: Male   Other Topics Concern  . Not on file   Social History Narrative   GETS REG EXERCISE         Health Maintenance  Topic Date Due  . HIV Screening  04/09/1986  . TETANUS/TDAP  04/25/2016  . INFLUENZA VACCINE  06/14/2017  . MAMMOGRAM  06/23/2017  . PAP SMEAR  03/02/2018    The following portions of the patient's history were reviewed and updated as appropriate: She  has a past medical history of Anxiety; Ovarian cyst (08/2015); and Thyroid disease. She  does not have any pertinent problems on file. She  has a past surgical history that includes Cesarean section; Nasal septum surgery; and Intrauterine device insertion. Her family history includes Breast cancer in her unknown relative; Cancer (age of onset: 50) in her maternal grandmother; Diabetes in her maternal grandfather; Hyperlipidemia in her father; Hypertension in her father. She  reports that she has never smoked. She has never used smokeless tobacco. She reports that she drinks alcohol. She reports that she does not use drugs. She has a current medication list which includes the following prescription(s): b-12, levonorgestrel, magnesium citrate, pregnenolone micronized, probiotic product, and turmeric. Current Outpatient Prescriptions on File Prior to Visit  Medication Sig Dispense Refill  .  levonorgestrel (MIRENA) 20 MCG/24HR IUD 1 each by Intrauterine route once.      . Probiotic Product (PROBIOTIC & ACIDOPHILUS EX ST PO) Take by mouth daily.    . TURMERIC PO Take by mouth daily.     No current facility-administered medications on file prior to visit.    She is allergic to potassium-containing compounds..  Review of Systems Review of Systems  Constitutional: Negative for activity change, appetite change and fatigue.  HENT: Negative for hearing loss, congestion, tinnitus and ear discharge.  dentist q54m Eyes: Negative for visual disturbance (see optho q1y -- vision corrected to 20/20 with glasses).  Respiratory: Negative for cough, chest tightness and shortness of breath.   Cardiovascular: Negative for chest pain, palpitations and leg swelling.  Gastrointestinal: Negative for abdominal pain, diarrhea, constipation and abdominal distention.  Genitourinary: Negative for urgency, frequency, decreased urine volume and difficulty urinating.  Musculoskeletal: Negative for back pain, arthralgias and gait problem.  Skin: Negative for color change, pallor and rash.  Neurological: Negative for dizziness, light-headedness, numbness and headaches.  Hematological: Negative for adenopathy. Does not bruise/bleed easily.  Psychiatric/Behavioral: Negative for suicidal ideas, confusion, sleep disturbance, self-injury, dysphoric mood, decreased concentration and agitation.       Objective:    BP 122/80 (BP Location: Left Arm, Patient Position: Sitting, Cuff Size: Normal)   Pulse 73   Temp 97.8 F (36.6 C) (Oral)   Ht 5\' 6"  (1.676 m)   Wt 178 lb  2 oz (80.8 kg)   SpO2 98%   BMI 28.75 kg/m  General appearance: alert, cooperative, appears stated age and no distress Head: Normocephalic, without obvious abnormality, atraumatic Eyes: conjunctivae/corneas clear. PERRL, EOM's intact. Fundi benign. Ears: normal TM's and external ear canals both ears Nose: Nares normal. Septum midline.  Mucosa normal. No drainage or sinus tenderness. Throat: lips, mucosa, and tongue normal; teeth and gums normal Neck: no adenopathy, no carotid bruit, no JVD, supple, symmetrical, trachea midline and thyroid not enlarged, symmetric, no tenderness/mass/nodules Back: symmetric, no curvature. ROM normal. No CVA tenderness. Lungs: clear to auscultation bilaterally Breasts: normal appearance, no masses or tenderness Heart: regular rate and rhythm, S1, S2 normal, no murmur, click, rub or gallop Abdomen: soft, non-tender; bowel sounds normal; no masses,  no organomegaly Pelvic: deferred Extremities: extremities normal, atraumatic, no cyanosis or edema Pulses: 2+ and symmetric Skin: Skin color, texture, turgor normal. No rashes or lesions Lymph nodes: Cervical, supraclavicular, and axillary nodes normal. Neurologic: Alert and oriented X 3, normal strength and tone. Normal symmetric reflexes. Normal coordination and gait    Assessment:    Healthy female exam.      Plan:    ghm utd Check labs  See After Visit Summary for Counseling Recommendations    1. Preventative health care See above - CBC with Differential/Platelet - Lipid panel - Comprehensive metabolic panel - TSH - POCT Urinalysis Dipstick (Automated)  2. Hashimoto's disease  - TSH - T3, free - T4, free - Thyroid antibodies  3. Need for Tdap vaccination  - Tdap vaccine greater than or equal to 7yo IM

## 2017-06-01 NOTE — Progress Notes (Signed)
Pre visit review using our clinic review tool, if applicable. No additional management support is needed unless otherwise documented below in the visit note. 

## 2017-06-01 NOTE — Patient Instructions (Signed)
Preventive Care 40-64 Years, Female Preventive care refers to lifestyle choices and visits with your health care provider that can promote health and wellness. What does preventive care include?  A yearly physical exam. This is also called an annual well check.  Dental exams once or twice a year.  Routine eye exams. Ask your health care provider how often you should have your eyes checked.  Personal lifestyle choices, including: ? Daily care of your teeth and gums. ? Regular physical activity. ? Eating a healthy diet. ? Avoiding tobacco and drug use. ? Limiting alcohol use. ? Practicing safe sex. ? Taking low-dose aspirin daily starting at age 58. ? Taking vitamin and mineral supplements as recommended by your health care provider. What happens during an annual well check? The services and screenings done by your health care provider during your annual well check will depend on your age, overall health, lifestyle risk factors, and family history of disease. Counseling Your health care provider may ask you questions about your:  Alcohol use.  Tobacco use.  Drug use.  Emotional well-being.  Home and relationship well-being.  Sexual activity.  Eating habits.  Work and work Statistician.  Method of birth control.  Menstrual cycle.  Pregnancy history.  Screening You may have the following tests or measurements:  Height, weight, and BMI.  Blood pressure.  Lipid and cholesterol levels. These may be checked every 5 years, or more frequently if you are over 81 years old.  Skin check.  Lung cancer screening. You may have this screening every year starting at age 78 if you have a 30-pack-year history of smoking and currently smoke or have quit within the past 15 years.  Fecal occult blood test (FOBT) of the stool. You may have this test every year starting at age 65.  Flexible sigmoidoscopy or colonoscopy. You may have a sigmoidoscopy every 5 years or a colonoscopy  every 10 years starting at age 30.  Hepatitis C blood test.  Hepatitis B blood test.  Sexually transmitted disease (STD) testing.  Diabetes screening. This is done by checking your blood sugar (glucose) after you have not eaten for a while (fasting). You may have this done every 1-3 years.  Mammogram. This may be done every 1-2 years. Talk to your health care provider about when you should start having regular mammograms. This may depend on whether you have a family history of breast cancer.  BRCA-related cancer screening. This may be done if you have a family history of breast, ovarian, tubal, or peritoneal cancers.  Pelvic exam and Pap test. This may be done every 3 years starting at age 80. Starting at age 36, this may be done every 5 years if you have a Pap test in combination with an HPV test.  Bone density scan. This is done to screen for osteoporosis. You may have this scan if you are at high risk for osteoporosis.  Discuss your test results, treatment options, and if necessary, the need for more tests with your health care provider. Vaccines Your health care provider may recommend certain vaccines, such as:  Influenza vaccine. This is recommended every year.  Tetanus, diphtheria, and acellular pertussis (Tdap, Td) vaccine. You may need a Td booster every 10 years.  Varicella vaccine. You may need this if you have not been vaccinated.  Zoster vaccine. You may need this after age 5.  Measles, mumps, and rubella (MMR) vaccine. You may need at least one dose of MMR if you were born in  1957 or later. You may also need a second dose.  Pneumococcal 13-valent conjugate (PCV13) vaccine. You may need this if you have certain conditions and were not previously vaccinated.  Pneumococcal polysaccharide (PPSV23) vaccine. You may need one or two doses if you smoke cigarettes or if you have certain conditions.  Meningococcal vaccine. You may need this if you have certain  conditions.  Hepatitis A vaccine. You may need this if you have certain conditions or if you travel or work in places where you may be exposed to hepatitis A.  Hepatitis B vaccine. You may need this if you have certain conditions or if you travel or work in places where you may be exposed to hepatitis B.  Haemophilus influenzae type b (Hib) vaccine. You may need this if you have certain conditions.  Talk to your health care provider about which screenings and vaccines you need and how often you need them. This information is not intended to replace advice given to you by your health care provider. Make sure you discuss any questions you have with your health care provider. Document Released: 11/27/2015 Document Revised: 07/20/2016 Document Reviewed: 09/01/2015 Elsevier Interactive Patient Education  2017 [employer].

## 2017-06-02 ENCOUNTER — Telehealth: Payer: Self-pay | Admitting: Family Medicine

## 2017-06-02 LAB — THYROID ANTIBODIES
THYROID PEROXIDASE ANTIBODY: 511 [IU]/mL — AB (ref <9)
Thyroglobulin Ab: 1 IU/mL (ref <2)

## 2017-06-02 NOTE — Telephone Encounter (Signed)
Faxed completed employee physical form to [phone] --labs completed. Copied/sent to scan/mailed the patient original.

## 2017-06-21 ENCOUNTER — Encounter: Payer: Self-pay | Admitting: Family Medicine

## 2017-06-26 ENCOUNTER — Ambulatory Visit (HOSPITAL_BASED_OUTPATIENT_CLINIC_OR_DEPARTMENT_OTHER)
Admission: RE | Admit: 2017-06-26 | Discharge: 2017-06-26 | Disposition: A | Discharge status: Home / Self Care | Payer: 59 | Source: Ambulatory Visit | Attending: Family Medicine | Admitting: Family Medicine

## 2017-06-26 ENCOUNTER — Encounter (HOSPITAL_BASED_OUTPATIENT_CLINIC_OR_DEPARTMENT_OTHER): Payer: Self-pay

## 2017-06-26 DIAGNOSIS — Z1231 Encounter for screening mammogram for malignant neoplasm of breast: Secondary | ICD-10-CM | POA: Diagnosis present

## 2017-08-17 ENCOUNTER — Telehealth: Payer: Self-pay | Admitting: Family Medicine

## 2017-08-17 NOTE — Telephone Encounter (Signed)
Caller name: Jestine  Relation to pt: self Call back number: [phone] Pharmacy: [id number] - [place], [place]  Reason for call: Pt is needing refill on Armour Thyroid 30 mgs 1 tablet a day in the morning- needing 90 day supply. Pt states spoke with provider about this med on her last visit. Please advise. (pt stated only has 3 pills left)

## 2017-08-18 ENCOUNTER — Encounter: Payer: Self-pay | Admitting: Family Medicine

## 2017-08-18 MED ORDER — THYROID 30 MG PO TABS: 30.0000 mg | ORAL_TABLET | Freq: Every day | ORAL | 1 refills | Status: DC

## 2017-08-18 NOTE — Telephone Encounter (Signed)
Sent in armour thyroid prescription Notified the patient left detailed message sent in.

## 2017-08-18 NOTE — Telephone Encounter (Signed)
Pt says that she never received a response from message. Pt says that in the future she would prefer if another provider didn't "but in" on her and her provider conversation. I did advise pt that our providers here in our office does cover and assist one anothers pt's when needed. Pt says that she understands but still would rather have response from PCP.    Please review. Pt says that she usually receives medication for 3 month supply.

## 2017-08-18 NOTE — Telephone Encounter (Signed)
Refill # 90 1 refill 

## 2017-08-18 NOTE — Telephone Encounter (Signed)
Pt called back in to follow up on refill request. Pt is now nervous because she said that she will be out of her medication over the weekend.    Please assist further.    Thanks.

## 2017-09-07 DIAGNOSIS — M2550 Pain in unspecified joint: Secondary | ICD-10-CM | POA: Diagnosis not present

## 2017-09-07 DIAGNOSIS — R5383 Other fatigue: Secondary | ICD-10-CM | POA: Diagnosis not present

## 2017-09-07 DIAGNOSIS — E063 Autoimmune thyroiditis: Secondary | ICD-10-CM | POA: Diagnosis not present

## 2017-09-07 DIAGNOSIS — E039 Hypothyroidism, unspecified: Secondary | ICD-10-CM | POA: Diagnosis not present

## 2017-09-19 DIAGNOSIS — L720 Epidermal cyst: Secondary | ICD-10-CM | POA: Diagnosis not present

## 2017-09-19 DIAGNOSIS — L570 Actinic keratosis: Secondary | ICD-10-CM | POA: Diagnosis not present

## 2017-09-19 DIAGNOSIS — D485 Neoplasm of uncertain behavior of skin: Secondary | ICD-10-CM | POA: Diagnosis not present

## 2018-01-10 DIAGNOSIS — D485 Neoplasm of uncertain behavior of skin: Secondary | ICD-10-CM | POA: Diagnosis not present

## 2018-01-10 DIAGNOSIS — C44319 Basal cell carcinoma of skin of other parts of face: Secondary | ICD-10-CM | POA: Diagnosis not present

## 2018-01-25 ENCOUNTER — Telehealth: Payer: Self-pay | Admitting: *Deleted

## 2018-01-25 DIAGNOSIS — I10 Essential (primary) hypertension: Secondary | ICD-10-CM

## 2018-01-25 DIAGNOSIS — E039 Hypothyroidism, unspecified: Secondary | ICD-10-CM

## 2018-01-25 NOTE — Telephone Encounter (Signed)
Copied from [place] [id number]. Topic: General - Other >> Jan 24, 2018 11:33 AM Yvette Rack wrote: Reason for CRM: patient has physical with labs on 06-21-18 with Dr Corinna Capra need lab orders

## 2018-01-26 NOTE — Telephone Encounter (Signed)
Left message on machine that orders are placed

## 2018-04-10 DIAGNOSIS — E039 Hypothyroidism, unspecified: Secondary | ICD-10-CM | POA: Diagnosis not present

## 2018-04-10 DIAGNOSIS — R6882 Decreased libido: Secondary | ICD-10-CM | POA: Diagnosis not present

## 2018-04-10 DIAGNOSIS — E063 Autoimmune thyroiditis: Secondary | ICD-10-CM | POA: Diagnosis not present

## 2018-06-12 ENCOUNTER — Telehealth: Payer: Self-pay | Admitting: Family Medicine

## 2018-06-12 NOTE — Telephone Encounter (Signed)
Copied from [place] [id number]. Topic: Quick Communication - See Telephone Encounter >> Jun 12, 2018  4:43 PM Genella Rife H wrote: CRM for notification. See Telephone encounter for: 06/12/18.  Left message appt needs to be rescheduled per pcp

## 2018-06-19 NOTE — Telephone Encounter (Signed)
Patient is requesting a call back in regards to  Rescheduling her Physical. Please advise

## 2018-06-20 NOTE — Telephone Encounter (Signed)
Called patient but received voicemail. Left message for her to call back.

## 2018-06-21 ENCOUNTER — Other Ambulatory Visit: Payer: 59

## 2018-06-21 ENCOUNTER — Encounter: Payer: 59 | Admitting: Family Medicine

## 2018-06-21 DIAGNOSIS — Z0289 Encounter for other administrative examinations: Secondary | ICD-10-CM

## 2018-07-17 DIAGNOSIS — E039 Hypothyroidism, unspecified: Secondary | ICD-10-CM | POA: Diagnosis not present

## 2018-07-17 DIAGNOSIS — E063 Autoimmune thyroiditis: Secondary | ICD-10-CM | POA: Diagnosis not present

## 2018-07-23 DIAGNOSIS — E039 Hypothyroidism, unspecified: Secondary | ICD-10-CM | POA: Diagnosis not present

## 2018-07-23 DIAGNOSIS — E063 Autoimmune thyroiditis: Secondary | ICD-10-CM | POA: Diagnosis not present

## 2018-07-23 DIAGNOSIS — R5383 Other fatigue: Secondary | ICD-10-CM | POA: Diagnosis not present

## 2018-08-06 ENCOUNTER — Telehealth: Payer: Self-pay | Admitting: Family Medicine

## 2018-08-06 NOTE — Telephone Encounter (Signed)
Copied from [place] [id number]. Topic: Quick Communication - See Telephone Encounter >> Aug 06, 2018 11:47 AM Genella Rife H wrote: CRM for notification. See Telephone encounter for: 08/06/18.  Left message appt needs to be rescheduled per pcp.

## 2018-09-04 ENCOUNTER — Other Ambulatory Visit: Payer: Self-pay | Admitting: *Deleted

## 2018-09-04 DIAGNOSIS — E039 Hypothyroidism, unspecified: Secondary | ICD-10-CM

## 2018-09-04 DIAGNOSIS — I10 Essential (primary) hypertension: Secondary | ICD-10-CM

## 2018-09-07 ENCOUNTER — Encounter: Payer: 59 | Admitting: Family Medicine

## 2018-09-10 ENCOUNTER — Other Ambulatory Visit: Payer: 59

## 2018-10-01 ENCOUNTER — Ambulatory Visit (INDEPENDENT_AMBULATORY_CARE_PROVIDER_SITE_OTHER): Payer: 59 | Admitting: Family Medicine

## 2018-10-01 ENCOUNTER — Other Ambulatory Visit (HOSPITAL_COMMUNITY)
Admission: RE | Admit: 2018-10-01 | Discharge: 2018-10-01 | Disposition: A | Discharge status: Home / Self Care | Payer: 59 | Source: Ambulatory Visit | Attending: Family Medicine | Admitting: Family Medicine

## 2018-10-01 ENCOUNTER — Encounter: Payer: Self-pay | Admitting: Family Medicine

## 2018-10-01 VITALS — BP 95/57 | HR 57 | Temp 98.0°F | Resp 16 | Ht 66.0 in | Wt 165.8 lb

## 2018-10-01 DIAGNOSIS — I10 Essential (primary) hypertension: Secondary | ICD-10-CM | POA: Diagnosis not present

## 2018-10-01 DIAGNOSIS — Z1211 Encounter for screening for malignant neoplasm of colon: Secondary | ICD-10-CM

## 2018-10-01 DIAGNOSIS — E039 Hypothyroidism, unspecified: Secondary | ICD-10-CM | POA: Diagnosis not present

## 2018-10-01 DIAGNOSIS — Z0000 Encounter for general adult medical examination without abnormal findings: Secondary | ICD-10-CM | POA: Diagnosis not present

## 2018-10-01 DIAGNOSIS — Z124 Encounter for screening for malignant neoplasm of cervix: Secondary | ICD-10-CM | POA: Diagnosis not present

## 2018-10-01 NOTE — Addendum Note (Signed)
Addended by: Kem Boroughs D on: 10/01/2018 05:28 PM   Modules accepted: Orders

## 2018-10-01 NOTE — Assessment & Plan Note (Signed)
Well controlled, no changes to meds. Encouraged heart healthy diet such as the DASH diet and exercise as tolerated.  °

## 2018-10-01 NOTE — Patient Instructions (Signed)
Preventive Care 40-64 Years, Female Preventive care refers to lifestyle choices and visits with your health care provider that can promote health and wellness. What does preventive care include?  A yearly physical exam. This is also called an annual well check.  Dental exams once or twice a year.  Routine eye exams. Ask your health care provider how often you should have your eyes checked.  Personal lifestyle choices, including: ? Daily care of your teeth and gums. ? Regular physical activity. ? Eating a healthy diet. ? Avoiding tobacco and drug use. ? Limiting alcohol use. ? Practicing safe sex. ? Taking low-dose aspirin daily starting at age 58. ? Taking vitamin and mineral supplements as recommended by your health care provider. What happens during an annual well check? The services and screenings done by your health care provider during your annual well check will depend on your age, overall health, lifestyle risk factors, and family history of disease. Counseling Your health care provider may ask you questions about your:  Alcohol use.  Tobacco use.  Drug use.  Emotional well-being.  Home and relationship well-being.  Sexual activity.  Eating habits.  Work and work Statistician.  Method of birth control.  Menstrual cycle.  Pregnancy history.  Screening You may have the following tests or measurements:  Height, weight, and BMI.  Blood pressure.  Lipid and cholesterol levels. These may be checked every 5 years, or more frequently if you are over 81 years old.  Skin check.  Lung cancer screening. You may have this screening every year starting at age 78 if you have a 30-pack-year history of smoking and currently smoke or have quit within the past 15 years.  Fecal occult blood test (FOBT) of the stool. You may have this test every year starting at age 65.  Flexible sigmoidoscopy or colonoscopy. You may have a sigmoidoscopy every 5 years or a colonoscopy  every 10 years starting at age 30.  Hepatitis C blood test.  Hepatitis B blood test.  Sexually transmitted disease (STD) testing.  Diabetes screening. This is done by checking your blood sugar (glucose) after you have not eaten for a while (fasting). You may have this done every 1-3 years.  Mammogram. This may be done every 1-2 years. Talk to your health care provider about when you should start having regular mammograms. This may depend on whether you have a family history of breast cancer.  BRCA-related cancer screening. This may be done if you have a family history of breast, ovarian, tubal, or peritoneal cancers.  Pelvic exam and Pap test. This may be done every 3 years starting at age 80. Starting at age 36, this may be done every 5 years if you have a Pap test in combination with an HPV test.  Bone density scan. This is done to screen for osteoporosis. You may have this scan if you are at high risk for osteoporosis.  Discuss your test results, treatment options, and if necessary, the need for more tests with your health care provider. Vaccines Your health care provider may recommend certain vaccines, such as:  Influenza vaccine. This is recommended every year.  Tetanus, diphtheria, and acellular pertussis (Tdap, Td) vaccine. You may need a Td booster every 10 years.  Varicella vaccine. You may need this if you have not been vaccinated.  Zoster vaccine. You may need this after age 5.  Measles, mumps, and rubella (MMR) vaccine. You may need at least one dose of MMR if you were born in  1957 or later. You may also need a second dose.  Pneumococcal 13-valent conjugate (PCV13) vaccine. You may need this if you have certain conditions and were not previously vaccinated.  Pneumococcal polysaccharide (PPSV23) vaccine. You may need one or two doses if you smoke cigarettes or if you have certain conditions.  Meningococcal vaccine. You may need this if you have certain  conditions.  Hepatitis A vaccine. You may need this if you have certain conditions or if you travel or work in places where you may be exposed to hepatitis A.  Hepatitis B vaccine. You may need this if you have certain conditions or if you travel or work in places where you may be exposed to hepatitis B.  Haemophilus influenzae type b (Hib) vaccine. You may need this if you have certain conditions.  Talk to your health care provider about which screenings and vaccines you need and how often you need them. This information is not intended to replace advice given to you by your health care provider. Make sure you discuss any questions you have with your health care provider. Document Released: 11/27/2015 Document Revised: 07/20/2016 Document Reviewed: 09/01/2015 Elsevier Interactive Patient Education  2018 [employer] Inc[employer]  

## 2018-10-01 NOTE — Progress Notes (Signed)
Subjective:     Gina Pineda is a 47 y.o. female and is here for a comprehensive physical exam. The patient reports no problems.  Social History   Socioeconomic History  . Marital status: Married    Spouse name: Not on file  . Number of children: 2  . Years of education: Not on file  . Highest education level: Not on file  Occupational History  . Occupation: massage therapist    Employer: [employer]  Social Needs  . Financial resource strain: Not on file  . Food insecurity:    Worry: Not on file    Inability: Not on file  . Transportation needs:    Medical: Not on file    Non-medical: Not on file  Tobacco Use  . Smoking status: Never Smoker  . Smokeless tobacco: Never Used  Substance and Sexual Activity  . Alcohol use: Yes    Comment: 4-5 drinks/ month  . Drug use: No  . Sexual activity: Yes    Partners: Male  Lifestyle  . Physical activity:    Days per week: Not on file    Minutes per session: Not on file  . Stress: Not on file  Relationships  . Social connections:    Talks on phone: Not on file    Gets together: Not on file    Attends religious service: Not on file    Active member of club or organization: Not on file    Attends meetings of clubs or organizations: Not on file    Relationship status: Not on file  . Intimate partner violence:    Fear of current or ex partner: Not on file    Emotionally abused: Not on file    Physically abused: Not on file    Forced sexual activity: Not on file  Other Topics Concern  . Not on file  Social History Narrative   GETS REG EXERCISE         Health Maintenance  Topic Date Due  . HIV Screening  04/09/1986  . PAP SMEAR  03/02/2018  . MAMMOGRAM  06/26/2018  . INFLUENZA VACCINE  06/15/2019 (Originally 06/14/2018)  . TETANUS/TDAP  06/02/2027    The following portions of the patient's history were reviewed and updated as appropriate: She  has a past medical history of Anxiety, Ovarian cyst (08/2015), and  Thyroid disease. She does not have any pertinent problems on file. She  has a past surgical history that includes Cesarean section; Nasal septum surgery; Intrauterine device insertion; and Breast biopsy. Her family history includes Breast cancer in her unknown relative; Cancer (age of onset: 15) in her maternal grandmother; Diabetes in her maternal grandfather; Hyperlipidemia in her father; Hypertension in her father. She  reports that she has never smoked. She has never used smokeless tobacco. She reports that she drinks alcohol. She reports that she does not use drugs. She has a current medication list which includes the following prescription(s): b-12, levonorgestrel, magnesium citrate, pregnenolone micronized, probiotic product, thyroid, and turmeric. Current Outpatient Medications on File Prior to Visit  Medication Sig Dispense Refill  . Cyanocobalamin (B-12) 5000 MCG CAPS Take 1 tablet by mouth daily.    Marland Kitchen levonorgestrel (MIRENA) 20 MCG/24HR IUD 1 each by Intrauterine route once.      Marland Kitchen MAGNESIUM CITRATE PO Take 1 tablet by mouth daily as needed.    . Pregnenolone Micronized POWD by Does not apply route.    . Probiotic Product (PROBIOTIC & ACIDOPHILUS EX ST PO) Take  by mouth daily.    Marland Kitchen thyroid (ARMOUR) 90 MG tablet Take 90 mg by mouth daily.    . TURMERIC PO Take by mouth daily.     No current facility-administered medications on file prior to visit.    She is allergic to potassium-containing compounds..  Review of Systems   Objective:    There were no vitals taken for this visit. General appearance: alert, cooperative, appears stated age and no distress Head: Normocephalic, without obvious abnormality, atraumatic Eyes: conjunctivae/corneas clear. PERRL, EOM's intact. Fundi benign. Ears: normal TM's and external ear canals both ears Nose: Nares normal. Septum midline. Mucosa normal. No drainage or sinus tenderness. Throat: lips, mucosa, and tongue normal; teeth and gums  normal Neck: no adenopathy, no carotid bruit, no JVD, supple, symmetrical, trachea midline and thyroid not enlarged, symmetric, no tenderness/mass/nodules Back: symmetric, no curvature. ROM normal. No CVA tenderness. Lungs: clear to auscultation bilaterally Breasts: normal appearance, no masses or tenderness Heart: regular rate and rhythm, S1, S2 normal, no murmur, click, rub or gallop Abdomen: soft, non-tender; bowel sounds normal; no masses,  no organomegaly Pelvic: cervix normal in appearance, external genitalia normal, no adnexal masses or tenderness, no cervical motion tenderness, rectovaginal septum normal, uterus normal size, shape, and consistency, vagina normal without discharge and pap done, rectal heme +--- pt c/o some constipation  Extremities: extremities normal, atraumatic, no cyanosis or edema Pulses: 2+ and symmetric Skin: Skin color, texture, turgor normal. No rashes or lesions Lymph nodes: Cervical, supraclavicular, and axillary nodes normal. Neurologic: Alert and oriented X 3, normal strength and tone. Normal symmetric reflexes. Normal coordination and gait    Assessment:    Healthy female exam.      Plan:     ghm utd Check labs  See After Visit Summary for Counseling Recommendations    1. Cervical cancer screening  - Cytology - PAP( )  2. Preventative health care See above - CBC with Differential/Platelet; Future - Lipid panel; Future - Cytology - PAP( ) - Thyroid Panel With TSH; Future - Comprehensive metabolic panel; Future

## 2018-10-01 NOTE — Assessment & Plan Note (Signed)
Per [facility]

## 2018-10-02 ENCOUNTER — Other Ambulatory Visit (INDEPENDENT_AMBULATORY_CARE_PROVIDER_SITE_OTHER): Payer: 59

## 2018-10-02 DIAGNOSIS — Z0000 Encounter for general adult medical examination without abnormal findings: Secondary | ICD-10-CM | POA: Diagnosis not present

## 2018-10-02 LAB — COMPREHENSIVE METABOLIC PANEL
ALBUMIN: 4.1 g/dL (ref 3.5–5.2)
ALK PHOS: 50 U/L (ref 39–117)
ALT: 11 U/L (ref 0–35)
AST: 12 U/L (ref 0–37)
BILIRUBIN TOTAL: 0.5 mg/dL (ref 0.2–1.2)
BUN: 15 mg/dL (ref 6–23)
CO2: 26 mEq/L (ref 19–32)
CREATININE: 0.63 mg/dL (ref 0.40–1.20)
Calcium: 9.2 mg/dL (ref 8.4–10.5)
Chloride: 106 mEq/L (ref 96–112)
GFR: 107.43 mL/min (ref >60.00)
GLUCOSE: 109 mg/dL — AB (ref 70–99)
Potassium: 4.4 mEq/L (ref 3.5–5.1)
SODIUM: 138 meq/L (ref 135–145)
TOTAL PROTEIN: 6.4 g/dL (ref 6.0–8.3)

## 2018-10-02 LAB — LIPID PANEL
CHOL/HDL RATIO: 3
Cholesterol: 144 mg/dL (ref 0–200)
HDL: 50.8 mg/dL (ref >39.00)
LDL CALC: 85 mg/dL (ref 0–99)
NONHDL: 93.43
Triglycerides: 40 mg/dL (ref 0.0–149.0)
VLDL: 8 mg/dL (ref 0.0–40.0)

## 2018-10-02 LAB — CBC WITH DIFFERENTIAL/PLATELET
Basophils Absolute: 0 10*3/uL (ref 0.0–0.1)
Basophils Relative: 0.6 % (ref 0.0–3.0)
Eosinophils Absolute: 0.1 10*3/uL (ref 0.0–0.7)
Eosinophils Relative: 2.4 % (ref 0.0–5.0)
HCT: 41.6 % (ref 36.0–46.0)
Hemoglobin: 13.9 g/dL (ref 12.0–15.0)
LYMPHS ABS: 1.3 10*3/uL (ref 0.7–4.0)
Lymphocytes Relative: 25.5 % (ref 12.0–46.0)
MCHC: 33.4 g/dL (ref 30.0–36.0)
MCV: 95.6 fl (ref 78.0–100.0)
MONO ABS: 0.4 10*3/uL (ref 0.1–1.0)
Monocytes Relative: 8.7 % (ref 3.0–12.0)
Neutro Abs: 3.2 10*3/uL (ref 1.4–7.7)
Neutrophils Relative %: 62.8 % (ref 43.0–77.0)
Platelets: 198 10*3/uL (ref 150.0–400.0)
RBC: 4.35 Mil/uL (ref 3.87–5.11)
RDW: 14.6 % (ref 11.5–15.5)
WBC: 5.1 10*3/uL (ref 4.0–10.5)

## 2018-10-02 LAB — THYROID PANEL WITH TSH
Free Thyroxine Index: 1.8 (ref 1.4–3.8)
T3 UPTAKE: 36 % — AB (ref 22–35)
T4, Total: 5 ug/dL — ABNORMAL LOW (ref 5.1–11.9)
TSH: 0.34 m[IU]/L — AB

## 2018-10-02 LAB — CYTOLOGY - PAP
DIAGNOSIS: NEGATIVE
HPV: NOT DETECTED

## 2018-10-08 ENCOUNTER — Telehealth: Payer: Self-pay | Admitting: *Deleted

## 2018-10-08 NOTE — Telephone Encounter (Signed)
Form faxed on [phone]

## 2019-06-09 ENCOUNTER — Other Ambulatory Visit: Payer: Self-pay

## 2019-06-09 ENCOUNTER — Encounter (HOSPITAL_COMMUNITY): Payer: Self-pay | Admitting: Emergency Medicine

## 2019-06-09 ENCOUNTER — Emergency Department (HOSPITAL_COMMUNITY)
Admission: EM | Admit: 2019-06-09 | Discharge: 2019-06-10 | Disposition: A | Discharge status: Home / Self Care | Payer: BC Managed Care – PPO | Attending: Emergency Medicine | Admitting: Emergency Medicine

## 2019-06-09 DIAGNOSIS — R1030 Lower abdominal pain, unspecified: Secondary | ICD-10-CM | POA: Diagnosis present

## 2019-06-09 DIAGNOSIS — R110 Nausea: Secondary | ICD-10-CM | POA: Diagnosis not present

## 2019-06-09 DIAGNOSIS — K5732 Diverticulitis of large intestine without perforation or abscess without bleeding: Secondary | ICD-10-CM | POA: Insufficient documentation

## 2019-06-09 DIAGNOSIS — E039 Hypothyroidism, unspecified: Secondary | ICD-10-CM | POA: Insufficient documentation

## 2019-06-09 DIAGNOSIS — K5792 Diverticulitis of intestine, part unspecified, without perforation or abscess without bleeding: Secondary | ICD-10-CM

## 2019-06-09 DIAGNOSIS — Z79899 Other long term (current) drug therapy: Secondary | ICD-10-CM | POA: Diagnosis not present

## 2019-06-09 DIAGNOSIS — I10 Essential (primary) hypertension: Secondary | ICD-10-CM | POA: Insufficient documentation

## 2019-06-09 LAB — URINALYSIS, ROUTINE W REFLEX MICROSCOPIC
Bacteria, UA: NONE SEEN
Bilirubin Urine: NEGATIVE
Glucose, UA: NEGATIVE mg/dL
Ketones, ur: 80 mg/dL — AB
Leukocytes,Ua: NEGATIVE
Nitrite: NEGATIVE
Protein, ur: NEGATIVE mg/dL
Specific Gravity, Urine: 1.02 (ref 1.005–1.030)
pH: 6 (ref 5.0–8.0)

## 2019-06-09 LAB — CBC
HCT: 44.9 % (ref 36.0–46.0)
Hemoglobin: 15.2 g/dL — ABNORMAL HIGH (ref 12.0–15.0)
MCH: 32.6 pg (ref 26.0–34.0)
MCHC: 33.9 g/dL (ref 30.0–36.0)
MCV: 96.4 fL (ref 80.0–100.0)
Platelets: 244 10*3/uL (ref 150–400)
RBC: 4.66 MIL/uL (ref 3.87–5.11)
RDW: 13 % (ref 11.5–15.5)
WBC: 10.8 10*3/uL — ABNORMAL HIGH (ref 4.0–10.5)
nRBC: 0 % (ref 0.0–0.2)

## 2019-06-09 LAB — COMPREHENSIVE METABOLIC PANEL
ALT: 12 U/L (ref 0–44)
AST: 15 U/L (ref 15–41)
Albumin: 4.7 g/dL (ref 3.5–5.0)
Alkaline Phosphatase: 55 U/L (ref 38–126)
Anion gap: 11 (ref 5–15)
BUN: 8 mg/dL (ref 6–20)
CO2: 24 mmol/L (ref 22–32)
Calcium: 9.3 mg/dL (ref 8.9–10.3)
Chloride: 100 mmol/L (ref 98–111)
Creatinine, Ser: 0.75 mg/dL (ref 0.44–1.00)
GFR calc Af Amer: >60 mL/min (ref >60)
GFR calc non Af Amer: >60 mL/min (ref >60)
Glucose, Bld: 110 mg/dL — ABNORMAL HIGH (ref 70–99)
Potassium: 3.1 mmol/L — ABNORMAL LOW (ref 3.5–5.1)
Sodium: 135 mmol/L (ref 135–145)
Total Bilirubin: 1.9 mg/dL — ABNORMAL HIGH (ref 0.3–1.2)
Total Protein: 8.4 g/dL — ABNORMAL HIGH (ref 6.5–8.1)

## 2019-06-09 LAB — LIPASE, BLOOD: Lipase: 94 U/L — ABNORMAL HIGH (ref 11–51)

## 2019-06-09 MED ORDER — ACETAMINOPHEN 325 MG PO TABS
650.0000 mg | ORAL_TABLET | Freq: Once | ORAL | Status: AC
  Administered 2019-06-09: 650 mg via ORAL
  Filled 2019-06-09: qty 2

## 2019-06-09 MED ORDER — MORPHINE SULFATE (PF) 2 MG/ML IV SOLN
2.0000 mg | Freq: Once | INTRAVENOUS | Status: AC
  Administered 2019-06-09: 2 mg via INTRAVENOUS
  Filled 2019-06-09: qty 1

## 2019-06-09 MED ORDER — ONDANSETRON HCL 4 MG/2ML IJ SOLN
4.0000 mg | Freq: Once | INTRAMUSCULAR | Status: AC
  Administered 2019-06-09: 4 mg via INTRAVENOUS
  Filled 2019-06-09: qty 2

## 2019-06-09 MED ORDER — SODIUM CHLORIDE 0.9% FLUSH
3.0000 mL | Freq: Once | INTRAVENOUS | Status: AC
  Administered 2019-06-09: 3 mL via INTRAVENOUS

## 2019-06-09 MED ORDER — SODIUM CHLORIDE 0.9 % IV BOLUS
1000.0000 mL | Freq: Once | INTRAVENOUS | Status: AC
  Administered 2019-06-09: 1000 mL via INTRAVENOUS

## 2019-06-09 MED ORDER — SODIUM CHLORIDE (PF) 0.9 % IJ SOLN
INTRAMUSCULAR | Status: AC
  Filled 2019-06-09: qty 50

## 2019-06-09 NOTE — ED Provider Notes (Signed)
[facility] DEPT Provider Note   CSN: [id number] Arrival date & time: 06/09/19  2036    History   Chief Complaint Chief Complaint  Patient presents with  . Abdominal Pain  . Nausea    HPI Gina Pineda is a 48 y.o. female.     48 y.o female with a PMH of HTN,Hypothryoid, Migraine presents to the ED with a chief complaint of abdominal pain x yesterday.  Patient reports she first noticed a sharp sensation to the lower abdomen area, has now radiated onto her right side and right lower back.  Patient is states the pain feels better with laying on her left side, also feels better with pressure.  She reports feeling clammy along with feverish while at home, states she recorded her temperature at home with a T-max of 100.  She reports she has applied a heating pad, taking some Tylenol for improvement in symptoms without relief.  Her last meal was this evening around 6 PM, reports having a baked potato.  She also endorses nausea at baseline but states no vomiting episodes.  Patient's last bowel movement was prior to arrival.  She does have a previous history of a C-section.  She denies any urinary symptoms, constipation, diarrhea, vomiting. Of note, patient does have a previous history of ovarian cyst, states this felt very similar to it however she usually has this resolved with heating pads.  The history is provided by the patient.    Past Medical History:  Diagnosis Date  . Anxiety   . Ovarian cyst 08/2015  . Thyroid disease    Hypothyroidism    Patient Active Problem List   Diagnosis Date Noted  . HTN (hypertension) 04/15/2016  . Pelvic pain in female 08/18/2015  . Palpitations 03/26/2014  . HYPERGLYCEMIA 12/07/2009  . BACK PAIN 12/29/2008  . FATIGUE 12/29/2008  . Hypothyroidism 03/01/2007  . MIGRAINE, COMMON 03/01/2007  . DEVIATED NASAL SEPTUM 03/01/2007    Past Surgical History:  Procedure Laterality Date  . BREAST BIOPSY    . CESAREAN  SECTION    . INTRAUTERINE DEVICE INSERTION    . NASAL SEPTUM SURGERY       OB History   No obstetric history on file.      Home Medications    Prior to Admission medications   Medication Sig Start Date End Date Taking? Authorizing Provider  cholecalciferol (VITAMIN D3) 25 MCG (1000 UT) tablet Take 1,000 Units by mouth daily.   Yes [provider]  Cyanocobalamin (B-12) 5000 MCG CAPS Take 1 tablet by mouth daily.   Yes [provider]  levonorgestrel (MIRENA) 20 MCG/24HR IUD 1 each by Intrauterine route once.     Yes [provider]  MAGNESIUM CITRATE PO Take 1 tablet by mouth daily.    Yes [provider]  NALTREXONE HCL PO Take 3 mg by mouth daily.   Yes [provider]  thyroid (ARMOUR) 90 MG tablet Take 90 mg by mouth daily.   Yes [provider]  TURMERIC PO Take by mouth daily.   Yes [provider]  vitamin k 100 MCG tablet Take 100 mcg by mouth daily.   Yes [provider]  ciprofloxacin (CIPRO) 500 MG tablet Take 1 tablet (500 mg total) by mouth 2 (two) times daily for 7 days. 06/10/19 06/17/19  Janeece Fitting, PA-C  docusate sodium (COLACE) 100 MG capsule Take 1 capsule (100 mg total) by mouth daily for 7 days. 06/10/19 06/17/19  Kardell Virgil,  Kenedy Haisley, PA-C  metroNIDAZOLE (FLAGYL) 500 MG tablet Take 1 tablet (500 mg total) by mouth 3 (three) times daily for 7 days. 06/10/19 06/17/19  Janeece Fitting, PA-C  oxyCODONE-acetaminophen (PERCOCET/ROXICET) 5-325 MG tablet Take 1 tablet by mouth every 4 (four) hours as needed for up to 5 days for severe pain. 06/10/19 06/15/19  Janeece Fitting, PA-C    Family History Family History  Problem Relation Age of Onset  . Diabetes Maternal Grandfather   . Hypertension Father   . Hyperlipidemia Father   . Breast cancer Other   . Cancer Maternal Grandmother 51       breast    Social History Social History   Tobacco Use  . Smoking status: Never Smoker  . Smokeless tobacco: Never Used   Substance Use Topics  . Alcohol use: Yes    Comment: 4-5 drinks/ month  . Drug use: No     Allergies   Potassium-containing compounds   Review of Systems Review of Systems  Constitutional: Negative for chills and fever.  HENT: Negative for ear pain and sore throat.   Eyes: Negative for pain and visual disturbance.  Respiratory: Negative for cough and shortness of breath.   Cardiovascular: Negative for chest pain and palpitations.  Gastrointestinal: Positive for abdominal pain and nausea. Negative for vomiting.  Genitourinary: Negative for dysuria and hematuria.  Musculoskeletal: Negative for arthralgias and back pain.  Skin: Negative for color change and rash.  Neurological: Negative for seizures and syncope.  All other systems reviewed and are negative.    Physical Exam Updated Vital Signs BP (!) 153/76   Pulse 83   Temp 98.7 F (37.1 C) (Oral)   Resp 16   SpO2 99%   Physical Exam Vitals signs and nursing note reviewed.  Constitutional:      General: She is not in acute distress.    Appearance: She is well-developed.     Comments: Non-ill-appearing.  HENT:     Head: Normocephalic and atraumatic.     Mouth/Throat:     Pharynx: No oropharyngeal exudate.  Eyes:     Pupils: Pupils are equal, round, and reactive to light.  Neck:     Musculoskeletal: Normal range of motion.  Cardiovascular:     Rate and Rhythm: Regular rhythm.     Heart sounds: Normal heart sounds.  Pulmonary:     Effort: Pulmonary effort is normal. No respiratory distress.     Breath sounds: Normal breath sounds.  Abdominal:     General: Abdomen is flat. Bowel sounds are decreased. There is no distension.     Palpations: Abdomen is soft.     Tenderness: There is abdominal tenderness in the right lower quadrant, suprapubic area and left lower quadrant. There is no right CVA tenderness, left CVA tenderness, guarding or rebound. Positive signs include McBurney's sign.  Musculoskeletal:         General: No tenderness or deformity.     Right lower leg: No edema.     Left lower leg: No edema.  Skin:    General: Skin is warm and dry.  Neurological:     Mental Status: She is alert and oriented to person, place, and time.      ED Treatments / Results  Labs (all labs ordered are listed, but only abnormal results are displayed) Labs Reviewed  LIPASE, BLOOD - Abnormal; Notable for the following components:      Result Value   Lipase 94 (*)    All other components within  normal limits  COMPREHENSIVE METABOLIC PANEL - Abnormal; Notable for the following components:   Potassium 3.1 (*)    Glucose, Bld 110 (*)    Total Protein 8.4 (*)    Total Bilirubin 1.9 (*)    All other components within normal limits  CBC - Abnormal; Notable for the following components:   WBC 10.8 (*)    Hemoglobin 15.2 (*)    All other components within normal limits  URINALYSIS, ROUTINE W REFLEX MICROSCOPIC - Abnormal; Notable for the following components:   Hgb urine dipstick MODERATE (*)    Ketones, ur 80 (*)    All other components within normal limits  PREGNANCY, URINE  PREGNANCY, URINE  I-STAT BETA HCG BLOOD, ED ([facility], [facility], [facility] ONLY)    EKG None  Radiology Ct Abdomen Pelvis W Contrast  Result Date: 06/10/2019 CLINICAL DATA:  Right lower quadrant abdominal pain EXAM: CT ABDOMEN AND PELVIS WITH CONTRAST TECHNIQUE: Multidetector CT imaging of the abdomen and pelvis was performed using the standard protocol following bolus administration of intravenous contrast. CONTRAST:  133mL OMNIPAQUE IOHEXOL 300 MG/ML  SOLN COMPARISON:  None. FINDINGS: Lower chest: 4 mm left lower lobe nodule (series 4/image 5). Hepatobiliary: Liver is within normal limits, noting focal fat/altered perfusion along the falciform ligament. Gallbladder is unremarkable. No intrahepatic or extrahepatic ductal dilatation. Pancreas: Within normal limits. Spleen: Within normal limits. Adrenals/Urinary Tract: Adrenal glands are within  normal limits. 3 mm nonobstructing right upper pole renal calculus (coronal image 84). Left kidney is within normal limits. No hydronephrosis. Bladder is within normal limits. Stomach/Bowel: Stomach is within normal limits. No evidence of bowel obstruction. Normal appendix (series 2/image 59). Mild pericolonic wall thickening in the left lower quadrant suggests mild sigmoid diverticulitis (series 2/image 54). No drainable fluid collection/abscess. No free air to suggest macroscopic perforation. Vascular/Lymphatic: No evidence of abdominal aortic aneurysm. Circumaortic left renal vein. No suspicious abdominopelvic lymphadenopathy. Reproductive: Uterus is notable for an IUD which is low-lying in the uterine body. Additionally, the arms of the IUD appear to be rotated and embedded in the myometrium (series 2/image 67). Bilateral ovaries are unremarkable. Other: Trace pelvic ascites (series 2/image 75). Musculoskeletal: Mild degenerative changes at L5-S1. IMPRESSION: Suspected mild sigmoid diverticulitis. No drainable fluid collection/abscess. No free air to suggest macroscopic perforation. Mild position, low-lying IUD, as above. The arms of the IUD appear to be embedded in the uterine myometrium. 3 mm nonobstructing right upper pole renal calculus. No hydronephrosis. No evidence of bowel obstruction.  Normal appendix. 4 mm left lower lobe pulmonary nodule, likely benign. No follow-up needed if patient is low-risk. Non-contrast chest CT can be considered in 12 months if patient is high-risk. This recommendation follows the consensus statement: Guidelines for Management of Incidental Pulmonary Nodules Detected on CT Images: From the Fleischner Society 2017; Radiology 2017; 284:228-243. Electronically Signed   By: Julian Hy M.D.   On: 06/10/2019 00:44    Procedures Procedures (including critical care time)  Medications Ordered in ED Medications  sodium chloride (PF) 0.9 % injection (has no administration  in time range)  sodium chloride flush (NS) 0.9 % injection 3 mL (3 mLs Intravenous Given 06/09/19 2316)  acetaminophen (TYLENOL) tablet 650 mg (650 mg Oral Given 06/09/19 2305)  sodium chloride 0.9 % bolus 1,000 mL (0 mLs Intravenous Stopped 06/10/19 0015)  ondansetron (ZOFRAN) injection 4 mg (4 mg Intravenous Given 06/09/19 2343)  morphine 2 MG/ML injection 2 mg (2 mg Intravenous Given 06/09/19 2343)  iohexol (OMNIPAQUE) 300 MG/ML solution 100  mL (100 mLs Intravenous Contrast Given 06/10/19 0016)     Initial Impression / Assessment and Plan / ED Course  I have reviewed the triage vital signs and the nursing notes.  Pertinent labs & imaging results that were available during my care of the patient were reviewed by me and considered in my medical decision making (see chart for details).       Patient with a past medical history of Hashimoto's presents to the ED with complaints of lower abdominal pain which began yesterday, reports she had a similar episode like this due to ovarian cyst however she is usually able to relieve the pain with a heating pad.  Has report no improvement in symptoms since they started.  Does endorse some nausea, no vomiting has had some decrease in food intake.  States the pain begins along the lower abdomen radiates onto the right lower quadrant and radiates to her back.  During primary evaluation patient is well-appearing, no electrolyte abnormality, does have some hypokalemia although mild.  Creatinine level is within normal limits, LFTs are unremarkable no upper abdominal pain noted on exam.  CBC showed slight leukocytosis of 10.8, hemoglobin slightly elevated.  UA does show some moderate hemoglobin, no white counts, no nitrites.  Denies any urinary symptoms at this time.  Pregnancy test was negative.  Lipase level noted to be slightly elevated at 94, denies any history of alcohol abuse, does not drink alcohol on a regular basis.  Currently does have a gallbladder, no prior  surgical abdominal history aside from a C-section.  Will obtain CT abdominal to further evaluate patient's complaint.  Patient was also given morphine 2 mg along with Zofran to help with her pain.  CT and pelvis showed: Suspected mild sigmoid diverticulitis. No drainable fluid  collection/abscess. No free air to suggest macroscopic perforation.    Mild position, low-lying IUD, as above. The arms of the IUD appear  to be embedded in the uterine myometrium.    3 mm nonobstructing right upper pole renal calculus. No  hydronephrosis.    No evidence of bowel obstruction. Normal appendix.    4 mm left lower lobe pulmonary nodule, likely benign. No follow-up  needed if patient is low-risk. Non-contrast chest CT can be  considered in 12 months if patient is high-risk. This recommendation  follows the consensus statement: Guidelines for Management of  Incidental Pulmonary Nodules Detected on CT Images: From the  Fleischner Society 2017; Radiology 2017; 284:228-243.     I have discussed this patient with my attending Dr. Dayna Barker, he agrees with management at this time.  These results have been discussed at length with patient, she was given a copy of her CT reports, patient does not smoke, does not have any previous history of cancer, was advised to follow-up if needed on her 3 mm nodule to her left lower lung.  Patient will take report to PCP as needed.  Will treat patient for diverticulitis today with Cipro, Flagyl along with medication for pain such as Percocet and a stool softener to prevent any further constipation.  She reports normal bowel movements at home prior to arrival.  With stable vital signs, afebrile patient stable for discharge.  Return precautions discussed at length.   Portions of this note were generated with Lobby[employer]. Dictation errors may occur despite best attempts at proofreading.  Final Clinical Impressions(s) / ED Diagnoses   Final diagnoses:   Diverticulitis    ED Discharge Orders  Ordered    metroNIDAZOLE (FLAGYL) 500 MG tablet  3 times daily     06/10/19 0114    ciprofloxacin (CIPRO) 500 MG tablet  2 times daily     06/10/19 0114    docusate sodium (COLACE) 100 MG capsule  Daily     06/10/19 0114    oxyCODONE-acetaminophen (PERCOCET/ROXICET) 5-325 MG tablet  Every 4 hours PRN     06/10/19 0114           Janeece Fitting, PA-C 06/10/19 0118    Virgel Manifold, MD 06/11/19 1708

## 2019-06-09 NOTE — ED Triage Notes (Signed)
Patient here from home with complaints of right lower abd pain radiating down into right hip that started Friday. Nausea, vomiting. Denies pregnancy.

## 2019-06-10 ENCOUNTER — Other Ambulatory Visit (HOSPITAL_BASED_OUTPATIENT_CLINIC_OR_DEPARTMENT_OTHER): Payer: Self-pay | Admitting: Family Medicine

## 2019-06-10 ENCOUNTER — Emergency Department (HOSPITAL_COMMUNITY): Payer: BC Managed Care – PPO

## 2019-06-10 ENCOUNTER — Encounter (HOSPITAL_COMMUNITY): Payer: Self-pay

## 2019-06-10 DIAGNOSIS — K5732 Diverticulitis of large intestine without perforation or abscess without bleeding: Secondary | ICD-10-CM | POA: Diagnosis not present

## 2019-06-10 DIAGNOSIS — Z1231 Encounter for screening mammogram for malignant neoplasm of breast: Secondary | ICD-10-CM

## 2019-06-10 LAB — PREGNANCY, URINE: Preg Test, Ur: NEGATIVE

## 2019-06-10 MED ORDER — DOCUSATE SODIUM 100 MG PO CAPS: 100.0000 mg | ORAL_CAPSULE | Freq: Every day | ORAL | 0 refills | Status: AC

## 2019-06-10 MED ORDER — METRONIDAZOLE 500 MG PO TABS: 500.0000 mg | ORAL_TABLET | Freq: Three times a day (TID) | ORAL | 0 refills | Status: AC

## 2019-06-10 MED ORDER — OXYCODONE-ACETAMINOPHEN 5-325 MG PO TABS: 1.0000 | ORAL_TABLET | ORAL | 0 refills | Status: AC | PRN

## 2019-06-10 MED ORDER — IOHEXOL 300 MG/ML  SOLN
100.0000 mL | Freq: Once | INTRAMUSCULAR | Status: AC | PRN
  Administered 2019-06-10: 100 mL via INTRAVENOUS

## 2019-06-10 MED ORDER — CIPROFLOXACIN HCL 500 MG PO TABS: 500.0000 mg | ORAL_TABLET | Freq: Two times a day (BID) | ORAL | 0 refills | Status: AC

## 2019-06-10 NOTE — Discharge Instructions (Signed)
I have prescribed antibiotics to help treat your diverticulitis.   Please take 1 tablet of each medication twice a day for the next 7 days.  I have also provided a short course of pain medication, please take this as needed, there is also a prescription for a stool softener attached to your paperwork.   Please make sure to take this to help with any constipation.  If you experience any fever, blood in your stool, worsening symptoms please return to the emergency department.

## 2019-06-12 ENCOUNTER — Telehealth: Payer: Self-pay | Admitting: Gastroenterology

## 2019-06-12 NOTE — Telephone Encounter (Signed)
I spoke with the pt and she would like to make an appt for follow up of diverticulitis diagnosed in the ED. No appts available with Dr Ardis Hughs at this time. Offered midlevel appt however the pt declines and states she will call back next week for an appt when our schedule is out.  She will follow recommendations provided by the ED until seen.  If she develops symptoms again she will go back to the ED.

## 2019-06-17 ENCOUNTER — Other Ambulatory Visit: Payer: Self-pay

## 2019-06-17 ENCOUNTER — Encounter (HOSPITAL_BASED_OUTPATIENT_CLINIC_OR_DEPARTMENT_OTHER): Payer: Self-pay

## 2019-06-17 ENCOUNTER — Ambulatory Visit (HOSPITAL_BASED_OUTPATIENT_CLINIC_OR_DEPARTMENT_OTHER)
Admission: RE | Admit: 2019-06-17 | Discharge: 2019-06-17 | Disposition: A | Discharge status: Home / Self Care | Payer: 59 | Source: Ambulatory Visit | Attending: Family Medicine | Admitting: Family Medicine

## 2019-06-17 DIAGNOSIS — Z1231 Encounter for screening mammogram for malignant neoplasm of breast: Secondary | ICD-10-CM | POA: Diagnosis not present

## 2019-10-04 ENCOUNTER — Encounter: Payer: Self-pay | Admitting: Family Medicine

## 2019-11-12 ENCOUNTER — Other Ambulatory Visit: Payer: Self-pay | Admitting: Family Medicine

## 2019-11-12 ENCOUNTER — Other Ambulatory Visit: Payer: Self-pay | Admitting: Physician Assistant

## 2019-11-12 DIAGNOSIS — R1011 Right upper quadrant pain: Secondary | ICD-10-CM

## 2019-11-25 ENCOUNTER — Ambulatory Visit
Admission: RE | Admit: 2019-11-25 | Discharge: 2019-11-25 | Disposition: A | Discharge status: Home / Self Care | Payer: BC Managed Care – PPO | Source: Ambulatory Visit | Attending: Physician Assistant | Admitting: Physician Assistant

## 2019-11-25 DIAGNOSIS — R1011 Right upper quadrant pain: Secondary | ICD-10-CM

## 2020-04-16 ENCOUNTER — Encounter (HOSPITAL_COMMUNITY): Payer: Self-pay

## 2020-04-16 ENCOUNTER — Emergency Department (HOSPITAL_COMMUNITY)
Admission: EM | Admit: 2020-04-16 | Discharge: 2020-04-17 | Disposition: A | Discharge status: Home / Self Care | Payer: 59 | Attending: Emergency Medicine | Admitting: Emergency Medicine

## 2020-04-16 ENCOUNTER — Emergency Department (HOSPITAL_COMMUNITY): Payer: 59

## 2020-04-16 DIAGNOSIS — R000 Tachycardia, unspecified: Secondary | ICD-10-CM | POA: Insufficient documentation

## 2020-04-16 DIAGNOSIS — R42 Dizziness and giddiness: Secondary | ICD-10-CM | POA: Diagnosis not present

## 2020-04-16 DIAGNOSIS — Z79899 Other long term (current) drug therapy: Secondary | ICD-10-CM | POA: Diagnosis not present

## 2020-04-16 DIAGNOSIS — R030 Elevated blood-pressure reading, without diagnosis of hypertension: Secondary | ICD-10-CM

## 2020-04-16 DIAGNOSIS — I10 Essential (primary) hypertension: Secondary | ICD-10-CM | POA: Insufficient documentation

## 2020-04-16 DIAGNOSIS — E039 Hypothyroidism, unspecified: Secondary | ICD-10-CM | POA: Diagnosis not present

## 2020-04-16 DIAGNOSIS — R519 Headache, unspecified: Secondary | ICD-10-CM | POA: Diagnosis not present

## 2020-04-16 DIAGNOSIS — F410 Panic disorder [episodic paroxysmal anxiety] without agoraphobia: Secondary | ICD-10-CM | POA: Insufficient documentation

## 2020-04-16 LAB — CBC
HCT: 46.1 % — ABNORMAL HIGH (ref 36.0–46.0)
Hemoglobin: 15.4 g/dL — ABNORMAL HIGH (ref 12.0–15.0)
MCH: 31.4 pg (ref 26.0–34.0)
MCHC: 33.4 g/dL (ref 30.0–36.0)
MCV: 93.9 fL (ref 80.0–100.0)
Platelets: 234 10*3/uL (ref 150–400)
RBC: 4.91 MIL/uL (ref 3.87–5.11)
RDW: 12.4 % (ref 11.5–15.5)
WBC: 12.4 10*3/uL — ABNORMAL HIGH (ref 4.0–10.5)
nRBC: 0 % (ref 0.0–0.2)

## 2020-04-16 LAB — DIFFERENTIAL
Abs Immature Granulocytes: 0.04 10*3/uL (ref 0.00–0.07)
Basophils Absolute: 0.1 10*3/uL (ref 0.0–0.1)
Basophils Relative: 0 %
Eosinophils Absolute: 0.1 10*3/uL (ref 0.0–0.5)
Eosinophils Relative: 0 %
Immature Granulocytes: 0 %
Lymphocytes Relative: 11 %
Lymphs Abs: 1.4 10*3/uL (ref 0.7–4.0)
Monocytes Absolute: 1 10*3/uL (ref 0.1–1.0)
Monocytes Relative: 8 %
Neutro Abs: 9.9 10*3/uL — ABNORMAL HIGH (ref 1.7–7.7)
Neutrophils Relative %: 81 %

## 2020-04-16 LAB — COMPREHENSIVE METABOLIC PANEL
ALT: 16 U/L (ref 0–44)
AST: 20 U/L (ref 15–41)
Albumin: 4.6 g/dL (ref 3.5–5.0)
Alkaline Phosphatase: 54 U/L (ref 38–126)
Anion gap: 13 (ref 5–15)
BUN: 12 mg/dL (ref 6–20)
CO2: 22 mmol/L (ref 22–32)
Calcium: 9.6 mg/dL (ref 8.9–10.3)
Chloride: 103 mmol/L (ref 98–111)
Creatinine, Ser: 0.83 mg/dL (ref 0.44–1.00)
GFR calc Af Amer: >60 mL/min (ref >60)
GFR calc non Af Amer: >60 mL/min (ref >60)
Glucose, Bld: 125 mg/dL — ABNORMAL HIGH (ref 70–99)
Potassium: 3.9 mmol/L (ref 3.5–5.1)
Sodium: 138 mmol/L (ref 135–145)
Total Bilirubin: 1.2 mg/dL (ref 0.3–1.2)
Total Protein: 7.7 g/dL (ref 6.5–8.1)

## 2020-04-16 LAB — I-STAT CHEM 8, ED
BUN: 13 mg/dL (ref 6–20)
Calcium, Ion: 1.21 mmol/L (ref 1.15–1.40)
Chloride: 102 mmol/L (ref 98–111)
Creatinine, Ser: 0.7 mg/dL (ref 0.44–1.00)
Glucose, Bld: 124 mg/dL — ABNORMAL HIGH (ref 70–99)
HCT: 47 % — ABNORMAL HIGH (ref 36.0–46.0)
Hemoglobin: 16 g/dL — ABNORMAL HIGH (ref 12.0–15.0)
Potassium: 3.6 mmol/L (ref 3.5–5.1)
Sodium: 140 mmol/L (ref 135–145)
TCO2: 23 mmol/L (ref 22–32)

## 2020-04-16 LAB — I-STAT BETA HCG BLOOD, ED (MC, WL, AP ONLY): I-stat hCG, quantitative: <5 m[IU]/mL (ref <5)

## 2020-04-16 LAB — APTT: aPTT: 30 seconds (ref 24–36)

## 2020-04-16 LAB — PROTIME-INR
INR: 1.1 (ref 0.8–1.2)
Prothrombin Time: 13.3 seconds (ref 11.4–15.2)

## 2020-04-16 MED ORDER — SODIUM CHLORIDE 0.9% FLUSH
3.0000 mL | Freq: Once | INTRAVENOUS | Status: AC
  Administered 2020-04-17: 3 mL via INTRAVENOUS

## 2020-04-16 NOTE — ED Triage Notes (Signed)
Pt arrives to ED w/ c/o headache and dizziness that started today. Pt also reports intermittent hypertension. AOx4, neuro intact in triage.

## 2020-04-17 ENCOUNTER — Other Ambulatory Visit: Payer: Self-pay

## 2020-04-17 LAB — CBG MONITORING, ED: Glucose-Capillary: 91 mg/dL (ref 70–99)

## 2020-04-17 MED ORDER — DIPHENHYDRAMINE HCL 50 MG/ML IJ SOLN
25.0000 mg | Freq: Once | INTRAMUSCULAR | Status: AC
  Administered 2020-04-17: 25 mg via INTRAVENOUS
  Filled 2020-04-17: qty 1

## 2020-04-17 MED ORDER — METOCLOPRAMIDE HCL 5 MG/ML IJ SOLN
10.0000 mg | Freq: Once | INTRAMUSCULAR | Status: AC
  Administered 2020-04-17: 10 mg via INTRAVENOUS
  Filled 2020-04-17: qty 2

## 2020-04-17 MED ORDER — SODIUM CHLORIDE 0.9 % IV BOLUS
1000.0000 mL | Freq: Once | INTRAVENOUS | Status: AC
  Administered 2020-04-17: 1000 mL via INTRAVENOUS

## 2020-04-17 MED ORDER — KETOROLAC TROMETHAMINE 30 MG/ML IJ SOLN
30.0000 mg | Freq: Once | INTRAMUSCULAR | Status: AC
  Administered 2020-04-17: 30 mg via INTRAVENOUS
  Filled 2020-04-17: qty 1

## 2020-04-17 NOTE — Discharge Instructions (Addendum)
Please follow-up with both your own doctor and with the neurologist.  The neurologist office should contact you.  It is possible that your headaches are causing your blood pressure to be elevated.  You will need to continue the work-up on an outpatient basis.  If your symptoms change or worsen, please return to the emergency department.  Your CT scan was normal tonight.

## 2020-04-17 NOTE — ED Provider Notes (Signed)
[facility] EMERGENCY DEPARTMENT Provider Note   CSN: [id number] Arrival date & time: 04/16/20  2207     History Chief Complaint  Patient presents with   Dizziness    Gina Pineda is a 49 y.o. female.  Patient presents to the emergency department with chief complaint of headache and high blood pressure.  She states that she has been having migraines.  These are not atypical for her.  She states that yesterday she was having a headache, and checked her blood pressure and found it to be quite high.  She states that she cannot remember what the numbers were.  She states that this caused her to have panic attack, which led her to believe she is having a heart attack and this prompted her to come to the emergency department for evaluation.  She says that she has been having frequent intermittent headaches.  She does not take medication for her migraines.  She has never been seen by a headache specialist.  She also reports that she has been having intermittent episodes of high blood pressure.  She does not currently take anything for blood pressure, but has been prescribed blood pressure medications in the past.  She denies any recent illnesses.  Denies any numbness, weakness, or tingling.  The history is provided by the patient. No language interpreter was used.       Past Medical History:  Diagnosis Date   Anxiety    Ovarian cyst 08/2015   Thyroid disease    Hypothyroidism    Patient Active Problem List   Diagnosis Date Noted   HTN (hypertension) 04/15/2016   Pelvic pain in female 08/18/2015   Palpitations 03/26/2014   HYPERGLYCEMIA 12/07/2009   BACK PAIN 12/29/2008   FATIGUE 12/29/2008   Hypothyroidism 03/01/2007   MIGRAINE, COMMON 03/01/2007   DEVIATED NASAL SEPTUM 03/01/2007    Past Surgical History:  Procedure Laterality Date   BREAST BIOPSY     CESAREAN SECTION     INTRAUTERINE DEVICE INSERTION     NASAL SEPTUM SURGERY       OB  History   No obstetric history on file.     Family History  Problem Relation Age of Onset   Diabetes Maternal Grandfather    Hypertension Father    Hyperlipidemia Father    Breast cancer Other    Cancer Maternal Grandmother 67       breast    Social History   Tobacco Use   Smoking status: Never Smoker   Smokeless tobacco: Never Used  Substance Use Topics   Alcohol use: Yes    Comment: 4-5 drinks/ month   Drug use: No    Home Medications Prior to Admission medications   Medication Sig Start Date End Date Taking? Authorizing Provider  cholecalciferol (VITAMIN D3) 25 MCG (1000 UT) tablet Take 1,000 Units by mouth daily.    [provider]  Cyanocobalamin (B-12) 5000 MCG CAPS Take 1 tablet by mouth daily.    [provider]  levonorgestrel (MIRENA) 20 MCG/24HR IUD 1 each by Intrauterine route once.      [provider]  MAGNESIUM CITRATE PO Take 1 tablet by mouth daily.     [provider]  NALTREXONE HCL PO Take 3 mg by mouth daily.    [provider]  thyroid (ARMOUR) 90 MG tablet Take 90 mg by mouth daily.    [provider]  TURMERIC PO Take by mouth daily.    [provider]  vitamin k 100 MCG tablet Take 100 mcg by mouth daily.    [provider]    Allergies    Potassium-containing compounds  Review of Systems   Review of Systems  All other systems reviewed and are negative.   Physical Exam Updated Vital Signs BP (!) 143/94    Pulse 72    Temp 98.2 F (36.8 C) (Oral)    Resp 18    Ht 5\' 6"  (1.676 m)    Wt 68 kg    SpO2 100%    BMI 24.21 kg/m   Physical Exam Vitals and nursing note reviewed.  Constitutional:      General: She is not in acute distress.    Appearance: She is well-developed.  HENT:     Head: Normocephalic and atraumatic.  Eyes:     Conjunctiva/sclera: Conjunctivae normal.  Cardiovascular:     Rate and Rhythm: Normal rate and regular rhythm.     Heart sounds:  No murmur.  Pulmonary:     Effort: Pulmonary effort is normal. No respiratory distress.     Breath sounds: Normal breath sounds.  Abdominal:     Palpations: Abdomen is soft.     Tenderness: There is no abdominal tenderness.  Musculoskeletal:        General: Normal range of motion.     Cervical back: Neck supple.     Comments: Range of motion and strength of bilateral upper and lower extremities 5/5  Skin:    General: Skin is warm and dry.  Neurological:     Mental Status: She is alert and oriented to person, place, and time.     Comments: CN III-XII intact, speech is clear, movements are goal oriented  Psychiatric:        Mood and Affect: Mood normal.        Behavior: Behavior normal.     ED Results / Procedures / Treatments   Labs (all labs ordered are listed, but only abnormal results are displayed) Labs Reviewed  CBC - Abnormal; Notable for the following components:      Result Value   WBC 12.4 (*)    Hemoglobin 15.4 (*)    HCT 46.1 (*)    All other components within normal limits  DIFFERENTIAL - Abnormal; Notable for the following components:   Neutro Abs 9.9 (*)    All other components within normal limits  COMPREHENSIVE METABOLIC PANEL - Abnormal; Notable for the following components:   Glucose, Bld 125 (*)    All other components within normal limits  I-STAT CHEM 8, ED - Abnormal; Notable for the following components:   Glucose, Bld 124 (*)    Hemoglobin 16.0 (*)    HCT 47.0 (*)    All other components within normal limits  PROTIME-INR  APTT  CBG MONITORING, ED  I-STAT BETA HCG BLOOD, ED ([facility], [facility], [facility] ONLY)    EKG None  Radiology CT HEAD WO CONTRAST  Result Date: 04/16/2020 CLINICAL DATA:  49 year old female with headache and dizziness. EXAM: CT HEAD WITHOUT CONTRAST TECHNIQUE: Contiguous axial images were obtained from the base of the skull through the vertex without intravenous contrast. COMPARISON:  None. FINDINGS: Brain: The ventricles and sulci  appropriate size for patient's age. The gray-white matter discrimination is preserved. There is no acute intracranial hemorrhage. No mass effect or midline shift. No extra-axial fluid collection. Vascular: No hyperdense vessel or unexpected calcification. Skull: Normal. Negative for fracture or focal lesion. Sinuses/Orbits: The visualized paranasal sinuses  and mastoid air cells are clear. Other: None IMPRESSION: Unremarkable noncontrast CT of the brain. Electronically Signed   By: Anner Crete M.D.   On: 04/16/2020 23:15    Procedures Procedures (including critical care time)  Medications Ordered in ED Medications  sodium chloride flush (NS) 0.9 % injection 3 mL (3 mLs Intravenous Given 04/17/20 0500)  ketorolac (TORADOL) 30 MG/ML injection 30 mg (30 mg Intravenous Given 04/17/20 0452)  diphenhydrAMINE (BENADRYL) injection 25 mg (25 mg Intravenous Given 04/17/20 0452)  metoCLOPramide (REGLAN) injection 10 mg (10 mg Intravenous Given 04/17/20 0452)  sodium chloride 0.9 % bolus 1,000 mL (1,000 mLs Intravenous New Bag/Given 04/17/20 0455)    ED Course  I have reviewed the triage vital signs and the nursing notes.  Pertinent labs & imaging results that were available during my care of the patient were reviewed by me and considered in my medical decision making (see chart for details).    MDM Rules/Calculators/A&P                      Patient with multiple complaints, primarily headache and high blood pressure.  Possible that her headache is causing her elevated blood pressure.  I will treat her headache, and recheck her blood pressure.  Labs and CT head were ordered in triage.  CT head is unremarkable.  No significantly concerning findings and laboratory work-up.  I think that the patient may benefit from being seen by a headache specialist.  I have placed an ambulatory referral for outpatient neurology.  At this time, I did not feel the patient needs any further emergent work-up in the emergency  department tonight.  She appears stable and can follow-up with her doctor on an outpatient basis.  She is instructed to ask her doctor about her elevated blood pressures. Final Clinical Impression(s) / ED Diagnoses Final diagnoses:  Nonintractable headache, unspecified chronicity pattern, unspecified headache type  Elevated blood pressure reading    Rx / DC Orders ED Discharge Orders    None       Montine Circle, PA-C 04/17/20 [id number]    Orpah Greek, MD 04/17/20 [phone]

## 2020-04-17 NOTE — ED Notes (Signed)
Patient  States she woke  Up yest and her heart was racing she took her b/p and it was high. C/o headache right side, states she is also being worked up by PCP for Thyroid issues. States she went to bed at 830 pm and felt like she had a panic attack, her b/p was up again, states she feels like something is going on with her body.

## 2020-12-09 ENCOUNTER — Other Ambulatory Visit: Payer: Self-pay

## 2020-12-09 ENCOUNTER — Telehealth (INDEPENDENT_AMBULATORY_CARE_PROVIDER_SITE_OTHER): Payer: 59 | Admitting: Family Medicine

## 2020-12-09 ENCOUNTER — Encounter: Payer: Self-pay | Admitting: Family Medicine

## 2020-12-09 VITALS — BP 142/96

## 2020-12-09 DIAGNOSIS — U071 COVID-19: Secondary | ICD-10-CM

## 2020-12-09 DIAGNOSIS — J0140 Acute pansinusitis, unspecified: Secondary | ICD-10-CM | POA: Diagnosis not present

## 2020-12-09 MED ORDER — AMOXICILLIN-POT CLAVULANATE 875-125 MG PO TABS: 1.0000 | ORAL_TABLET | Freq: Two times a day (BID) | ORAL | 0 refills | Status: DC

## 2020-12-09 MED ORDER — FLUTICASONE PROPIONATE 50 MCG/ACT NA SUSP: 2.0000 | Freq: Every day | NASAL | 6 refills | Status: DC

## 2020-12-09 NOTE — Progress Notes (Signed)
Virtual Visit via Video Note  I connected with Gina Pineda on 12/09/20 at 10:20 AM EST by a video enabled telemedicine application and verified that I am speaking with the correct person using two identifiers.  Location/participants in visit  Patient: home alone Provider: office    I discussed the limitations of evaluation and management by telemedicine and the availability of in person appointments. The patient expressed understanding and agreed to proceed.  History of Present Illness: Pt is home c/o + covid ---- her sinus symptoms started Sunday and she tested + yesterday   Pt did not get the vaccine  Pt is taking vita d otc  + sinus headache/ congestion for weeks and her bp has been running high    Observations/Objective: Vitals:   12/09/20 1031  BP: (!) 142/96  pt is in bed--- c/o headache and congestion with sinus pressure  + chills and feels not but no fever  No sob    Assessment and Plan: 1. Acute non-recurrent pansinusitis abx and flonase per orders Antihistamine otc  covid clinic if no improvement  - amoxicillin-clavulanate (AUGMENTIN) 875-125 MG tablet; Take 1 tablet by mouth 2 (two) times daily.  Dispense: 20 tablet; Refill: 0 - fluticasone (FLONASE) 50 MCG/ACT nasal spray; Place 2 sprays into both nostrils daily.  Dispense: 16 g; Refill: 6  2. COVID-19 Probably causing symptoms but congestion / sinus ha x weeks so will tx with abx   Follow Up Instructions:    I discussed the assessment and treatment plan with the patient. The patient was provided an opportunity to ask questions and all were answered. The patient agreed with the plan and demonstrated an understanding of the instructions.   The patient was advised to call back or seek an in-person evaluation if the symptoms worsen or if the condition fails to improve as anticipated.  I provided 25 minutes of non-face-to-face time during this encounter.   Ann Held, DO

## 2020-12-25 NOTE — Progress Notes (Signed)
Referring-Yvonne Lowne Chase DO Reason for referral-palpitations  HPI: 50 year old female for evaluation of palpitations at request of Roma Schanz DO.  Patient seen previously but not since May 2015. Echocardiogram in March of 2015 showed normal LV function, mild aortic insufficiency and mild tricuspid regurgitation. Event monitor in April of 2015 showed sinus rhythm.  TSH February 14 0.16.  Free T3 5.8.  Free T4 1.13.  Patient states she will feel a pounding sensation in her chest at times.  It is worse in the mornings and improves throughout the day.  She has minimal dyspnea on exertion.  No orthopnea or PND but occasional pedal edema.  She has not had chest pain or syncope.  Because of the above cardiology asked to evaluate.  Current Outpatient Medications  Medication Sig Dispense Refill  . cholecalciferol (VITAMIN D3) 25 MCG (1000 UT) tablet Take 1,000 Units by mouth daily.    . fluticasone (FLONASE) 50 MCG/ACT nasal spray Place 2 sprays into both nostrils daily. 16 g 6  . MAGNESIUM CITRATE PO Take 1 tablet by mouth daily.     Marland Kitchen NALTREXONE HCL PO Take 3 mg by mouth daily.    Marland Kitchen thyroid (ARMOUR) 90 MG tablet Take 90 mg by mouth daily.    . vitamin k 100 MCG tablet Take 100 mcg by mouth daily.     No current facility-administered medications for this visit.    Allergies  Allergen Reactions  . Potassium-Containing Compounds Swelling     Past Medical History:  Diagnosis Date  . Anxiety   . Ovarian cyst 08/2015  . Thyroid disease    Hypothyroidism    Past Surgical History:  Procedure Laterality Date  . BREAST BIOPSY    . CESAREAN SECTION    . INTRAUTERINE DEVICE INSERTION    . NASAL SEPTUM SURGERY      Social History   Socioeconomic History  . Marital status: Married    Spouse name: Not on file  . Number of children: 2  . Years of education: Not on file  . Highest education level: Not on file  Occupational History  . Occupation: massage therapist     Employer: [employer]  Tobacco Use  . Smoking status: Never Smoker  . Smokeless tobacco: Never Used  Substance and Sexual Activity  . Alcohol use: Yes    Comment: 4-5 drinks/ month  . Drug use: No  . Sexual activity: Yes    Partners: Male  Other Topics Concern  . Not on file  Social History Narrative   GETS REG EXERCISE         Social Determinants of Health   Financial Resource Strain: Not on file  Food Insecurity: Not on file  Transportation Needs: Not on file  Physical Activity: Not on file  Stress: Not on file  Social Connections: Not on file  Intimate Partner Violence: Not on file    Family History  Problem Relation Age of Onset  . Diabetes Maternal Grandfather   . Hypertension Father   . Hyperlipidemia Father   . Breast cancer Other   . Cancer Maternal Grandmother 70       breast    ROS: no fevers or chills, productive cough, hemoptysis, dysphasia, odynophagia, melena, hematochezia, dysuria, hematuria, rash, seizure activity, orthopnea, PND, pedal edema, claudication. Remaining systems are negative.  Physical Exam:   Blood pressure 126/74, pulse 71, height 5\' 6"  (1.676 m), weight 151 lb 1.6 oz (68.5 kg).  General:  Well developed/well nourished  in NAD Skin warm/dry Patient not depressed No peripheral clubbing Back-normal HEENT-normal/normal eyelids Neck supple/normal carotid upstroke bilaterally; no bruits; no JVD; no thyromegaly chest - CTA/ normal expansion CV - RRR/normal S1 and S2; no murmurs, rubs or gallops;  PMI nondisplaced Abdomen -NT/ND, no HSM, no mass, + bowel sounds, no bruit 2+ femoral pulses, no bruits Ext-no edema, chords, 2+ DP Neuro-grossly nonfocal  ECG -normal sinus rhythm at a rate of 71, no ST changes.  Personally reviewed  A/P  1 palpitations-patient states that she did have symptoms during her electrocardiogram which showed sinus rhythm.  I wonder if iatrogenic hyperthyroidism is contributing as her TSH is low.  She will  follow up with primary care for adjustment of the dose.  If her symptoms persist after that we could consider low-dose beta-blocker.  We also discussed the potential of an apple watch to record any additional symptoms.  Plan repeat echocardiogram.  2 mild aortic insufficiency-noted on previous echocardiogram.  We will repeat study.  Kirk Ruths, MD

## 2020-12-28 ENCOUNTER — Ambulatory Visit (INDEPENDENT_AMBULATORY_CARE_PROVIDER_SITE_OTHER): Payer: 59 | Admitting: Family Medicine

## 2020-12-28 ENCOUNTER — Encounter: Payer: Self-pay | Admitting: Family Medicine

## 2020-12-28 ENCOUNTER — Other Ambulatory Visit: Payer: Self-pay

## 2020-12-28 VITALS — BP 130/78 | HR 86 | Temp 98.5°F | Resp 18 | Ht 66.0 in | Wt 146.2 lb

## 2020-12-28 DIAGNOSIS — M791 Myalgia, unspecified site: Secondary | ICD-10-CM | POA: Diagnosis not present

## 2020-12-28 DIAGNOSIS — Z862 Personal history of diseases of the blood and blood-forming organs and certain disorders involving the immune mechanism: Secondary | ICD-10-CM | POA: Diagnosis not present

## 2020-12-28 DIAGNOSIS — R519 Headache, unspecified: Secondary | ICD-10-CM | POA: Diagnosis not present

## 2020-12-28 DIAGNOSIS — E039 Hypothyroidism, unspecified: Secondary | ICD-10-CM

## 2020-12-28 DIAGNOSIS — M2550 Pain in unspecified joint: Secondary | ICD-10-CM

## 2020-12-28 DIAGNOSIS — G43019 Migraine without aura, intractable, without status migrainosus: Secondary | ICD-10-CM

## 2020-12-28 LAB — CBC WITH DIFFERENTIAL/PLATELET
Basophils Absolute: 0 10*3/uL (ref 0.0–0.1)
Basophils Relative: 0.7 % (ref 0.0–3.0)
Eosinophils Absolute: 0 10*3/uL (ref 0.0–0.7)
Eosinophils Relative: 0.4 % (ref 0.0–5.0)
HCT: 42.8 % (ref 36.0–46.0)
Hemoglobin: 14.6 g/dL (ref 12.0–15.0)
Lymphocytes Relative: 22.9 % (ref 12.0–46.0)
Lymphs Abs: 1 10*3/uL (ref 0.7–4.0)
MCHC: 34.1 g/dL (ref 30.0–36.0)
MCV: 91.9 fl (ref 78.0–100.0)
Monocytes Absolute: 0.4 10*3/uL (ref 0.1–1.0)
Monocytes Relative: 9.7 % (ref 3.0–12.0)
Neutro Abs: 2.9 10*3/uL (ref 1.4–7.7)
Neutrophils Relative %: 66.3 % (ref 43.0–77.0)
Platelets: 212 10*3/uL (ref 150.0–400.0)
RBC: 4.66 Mil/uL (ref 3.87–5.11)
RDW: 12.7 % (ref 11.5–15.5)
WBC: 4.4 10*3/uL (ref 4.0–10.5)

## 2020-12-28 LAB — T3, FREE: T3, Free: 5.8 pg/mL — ABNORMAL HIGH (ref 2.3–4.2)

## 2020-12-28 LAB — COMPREHENSIVE METABOLIC PANEL
ALT: 10 U/L (ref 0–35)
AST: 13 U/L (ref 0–37)
Albumin: 4.7 g/dL (ref 3.5–5.2)
Alkaline Phosphatase: 49 U/L (ref 39–117)
BUN: 11 mg/dL (ref 6–23)
CO2: 27 mEq/L (ref 19–32)
Calcium: 9.9 mg/dL (ref 8.4–10.5)
Chloride: 101 mEq/L (ref 96–112)
Creatinine, Ser: 0.65 mg/dL (ref 0.40–1.20)
GFR: 103.17 mL/min (ref >60.00)
Glucose, Bld: 111 mg/dL — ABNORMAL HIGH (ref 70–99)
Potassium: 4 mEq/L (ref 3.5–5.1)
Sodium: 138 mEq/L (ref 135–145)
Total Bilirubin: 0.9 mg/dL (ref 0.2–1.2)
Total Protein: 7.7 g/dL (ref 6.0–8.3)

## 2020-12-28 LAB — TSH: TSH: 0.16 u[IU]/mL — ABNORMAL LOW (ref 0.35–4.50)

## 2020-12-28 LAB — IBC + FERRITIN
Ferritin: 199.5 ng/mL (ref 10.0–291.0)
Iron: 86 ug/dL (ref 42–145)
Saturation Ratios: 26.4 % (ref 20.0–50.0)
Transferrin: 233 mg/dL (ref 212.0–360.0)

## 2020-12-28 LAB — T4, FREE: Free T4: 1.13 ng/dL (ref 0.60–1.60)

## 2020-12-28 LAB — VITAMIN B12: Vitamin B-12: >1506 pg/mL — ABNORMAL HIGH (ref 211–911)

## 2020-12-28 LAB — SEDIMENTATION RATE: Sed Rate: 3 mm/hr (ref 0–20)

## 2020-12-28 NOTE — Assessment & Plan Note (Signed)
Check labs 

## 2020-12-28 NOTE — Assessment & Plan Note (Signed)
Check labs  Refer to hematology 

## 2020-12-28 NOTE — Assessment & Plan Note (Signed)
For 3 days at a time monthly Mri brain  Consider neuro

## 2020-12-28 NOTE — Progress Notes (Signed)
Patient ID: Gina Pineda, female    DOB: 09/10/1971  Age: 50 y.o. MRN: [id number]    Subjective:  Subjective  HPI Gina Pineda presents for frequent headaches since covid --- at least 1 x a month and lasts 3 days   She is also having mult joint pains and myalgias and says she was dx in 1999 with antiphospholipid syndrome due to 2 miscarriages and her ferritin was recently high   Pt is concerned especially because she recently had covid.    Review of Systems  Constitutional: Negative for activity change, appetite change, fatigue and unexpected weight change.  Respiratory: Negative for cough and shortness of breath.   Cardiovascular: Negative for chest pain and palpitations.  Musculoskeletal: Positive for arthralgias and myalgias.  Neurological: Positive for headaches.  Psychiatric/Behavioral: Negative for behavioral problems and dysphoric mood. The patient is not nervous/anxious.     History Past Medical History:  Diagnosis Date  . Anxiety   . Ovarian cyst 08/2015  . Thyroid disease    Hypothyroidism    She has a past surgical history that includes Cesarean section; Nasal septum surgery; Intrauterine device insertion; and Breast biopsy.   Her family history includes Breast cancer in an other family member; Cancer (age of onset: 22) in her maternal grandmother; Diabetes in her maternal grandfather; Hyperlipidemia in her father; Hypertension in her father.She reports that she has never smoked. She has never used smokeless tobacco. She reports current alcohol use. She reports that she does not use drugs.  Current Outpatient Medications on File Prior to Visit  Medication Sig Dispense Refill  . cholecalciferol (VITAMIN D3) 25 MCG (1000 UT) tablet Take 1,000 Units by mouth daily.    . Cyanocobalamin (B-12) 5000 MCG CAPS Take 1 tablet by mouth daily.    . fluticasone (FLONASE) 50 MCG/ACT nasal spray Place 2 sprays into both nostrils daily. 16 g 6  . MAGNESIUM CITRATE PO Take 1 tablet  by mouth daily.     Marland Kitchen NALTREXONE HCL PO Take 3 mg by mouth daily.    Marland Kitchen thyroid (ARMOUR) 90 MG tablet Take 90 mg by mouth daily.    . vitamin k 100 MCG tablet Take 100 mcg by mouth daily.     No current facility-administered medications on file prior to visit.     Objective:  Objective  Physical Exam Vitals and nursing note reviewed.  Constitutional:      Appearance: She is well-developed and well-nourished.  HENT:     Head: Normocephalic and atraumatic.  Eyes:     Extraocular Movements: EOM normal.     Conjunctiva/sclera: Conjunctivae normal.  Neck:     Thyroid: No thyromegaly.     Vascular: No carotid bruit or JVD.  Cardiovascular:     Rate and Rhythm: Normal rate and regular rhythm.     Heart sounds: Normal heart sounds. No murmur heard.   Pulmonary:     Effort: Pulmonary effort is normal. No respiratory distress.     Breath sounds: Normal breath sounds. No wheezing or rales.  Chest:     Chest wall: No tenderness.  Musculoskeletal:        General: No edema.     Cervical back: Normal range of motion and neck supple.  Neurological:     Mental Status: She is alert and oriented to person, place, and time.  Psychiatric:        Attention and Perception: Attention and perception normal.        Mood and  Affect: Mood and affect and mood normal.        Speech: Speech normal.        Cognition and Memory: Cognition normal.    BP 130/78 (BP Location: Right Arm, Patient Position: Sitting, Cuff Size: Normal)   Pulse 86   Temp 98.5 F (36.9 C) (Oral)   Resp 18   Ht 5\' 6"  (1.676 m)   Wt 146 lb 3.2 oz (66.3 kg)   SpO2 100%   BMI 23.60 kg/m  Wt Readings from Last 3 Encounters:  12/28/20 146 lb 3.2 oz (66.3 kg)  04/17/20 150 lb (68 kg)  10/01/18 165 lb 12.8 oz (75.2 kg)     Lab Results  Component Value Date   WBC 12.4 (H) 04/16/2020   HGB 16.0 (H) 04/16/2020   HCT 47.0 (H) 04/16/2020   PLT 234 04/16/2020   GLUCOSE 124 (H) 04/16/2020   CHOL 144 10/02/2018   TRIG 40.0  10/02/2018   HDL 50.80 10/02/2018   LDLCALC 85 10/02/2018   ALT 16 04/16/2020   AST 20 04/16/2020   NA 140 04/16/2020   K 3.6 04/16/2020   CL 102 04/16/2020   CREATININE 0.70 04/16/2020   BUN 13 04/16/2020   CO2 22 04/16/2020   TSH 0.34 (L) 10/02/2018   INR 1.1 04/16/2020   HGBA1C 5.5 12/07/2009    CT HEAD WO CONTRAST  Result Date: 04/16/2020 CLINICAL DATA:  50 year old female with headache and dizziness. EXAM: CT HEAD WITHOUT CONTRAST TECHNIQUE: Contiguous axial images were obtained from the base of the skull through the vertex without intravenous contrast. COMPARISON:  None. FINDINGS: Brain: The ventricles and sulci appropriate size for patient's age. The gray-white matter discrimination is preserved. There is no acute intracranial hemorrhage. No mass effect or midline shift. No extra-axial fluid collection. Vascular: No hyperdense vessel or unexpected calcification. Skull: Normal. Negative for fracture or focal lesion. Sinuses/Orbits: The visualized paranasal sinuses and mastoid air cells are clear. Other: None IMPRESSION: Unremarkable noncontrast CT of the brain. Electronically Signed   By: Anner Crete M.D.   On: 04/16/2020 23:15     Assessment & Plan:  Plan  I have discontinued Bryson Ha A. Sheridan's levonorgestrel and amoxicillin-clavulanate. I am also having her maintain her MAGNESIUM CITRATE PO, B-12, thyroid, NALTREXONE HCL PO, cholecalciferol, vitamin k, and fluticasone.  No orders of the defined types were placed in this encounter.   Problem List Items Addressed This Visit      Unprioritized   History of antiphospholipid antibody syndrome    Check labs Refer to hematology      Relevant Orders   Ambulatory referral to Hematology   IBC + Ferritin   MR Brain Wo Contrast   T3, free   Hypothyroidism    Check labs      Migraine without aura    For 3 days at a time monthly Mri brain  Consider neuro       Myalgia   Relevant Orders   CBC with  Differential/Platelet   TSH   Vitamin B12   Comprehensive metabolic panel   Antinuclear Antib (ANA)   Rheumatoid Factor   Sedimentation rate   IBC + Ferritin   T3, free   T4, free   Pain in joint, multiple sites - Primary    Check labs  Tylenol arthritis      Relevant Orders   CBC with Differential/Platelet   TSH   Vitamin B12   Comprehensive metabolic panel   Antinuclear Antib (ANA)  Rheumatoid Factor   Sedimentation rate   IBC + Ferritin    Other Visit Diagnoses    Frequent headaches       Relevant Orders   CBC with Differential/Platelet   TSH   Vitamin B12   Comprehensive metabolic panel   Antinuclear Antib (ANA)   Rheumatoid Factor   Ambulatory referral to Hematology   IBC + Ferritin   MR Brain Wo Contrast   T3, free   T4, free      Follow-up: Return if symptoms worsen or fail to improve.  Ann Held, DO

## 2020-12-28 NOTE — Addendum Note (Signed)
Addended by: Roma Schanz R on: 12/28/2020 01:53 PM   Modules accepted: Orders

## 2020-12-28 NOTE — Patient Instructions (Signed)
Chronic Migraine Headache A migraine headache is throbbing pain that is usually on one side of the head. Migraines that keep coming back are called recurring migraines. A migraine is called a chronic migraine if it happens at least 15 days in a month for more than 3 months. Talk with your doctor about what things may bring on (trigger) your migraines. What are the causes? The exact cause of this condition is not known. A migraine may be caused when nerves in the brain become irritated and release chemicals that cause irritation and swelling (inflammation) of blood vessels. The irritation and swelling of the blood vessels causes pain. Migraines may be brought on or caused by:  Smoking.  Foods and drinks, such as: ? Cheese. ? Chocolate. ? Alcohol. ? Caffeine.  Certain substances in some foods or drinks.  Some medicines. Other things that may bring on a migraine include:  Periods, for women.  Stress.  Not enough sleep or too much sleep.  Feeling very tired.  Bright lights or loud noises.  Smells  Weather changes and being at high altitude. What increases the risk? The following factors may make you more likely to have chronic migraine:  Having migraines or family members who have them.  Being very sad (depressed) or feeling worried or nervous (anxious).  Taking a lot of pain medicine.  Having problems sleeping.  Having heart disease, diabetes, or being very overweight (obese). What are the signs or symptoms? Symptoms of this condition include:  Pain that feels like it throbs.  Pain that is usually only on one side of the head. In some cases, the pain may be on both sides of the head or around the head or neck.  Very bad pain that keeps you from doing daily activities.  Pain that gets worse with activity.  Feeling like you may vomit (feeling nauseous) or vomiting.  Pain when you are around bright lights, loud noises, or activity.  Being sensitive to bright  lights, loud noises, or smells.  Feeling dizzy. How is this treated? This condition is treated with:  Medicines. These help to: ? Lessen pain and the feeling like you may vomit. ? Prevent migraines.  Changes to your diet or sleep.  Therapy. This might include: ? Relaxation training. ? Biofeedback. This is a treatment that teaches you to relax, use your brain to lower your heart rate, and control your breathing. ? Cognitive behavioral therapy (CBT). This therapy helps you set goals and follow up on the changes that you make.  Acupuncture.  Using a device that provides electrical stimulation to your nerves, which can help take away pain.  Surgery, if the other treatments do not work. Follow these instructions at home: Medicines  Take over-the-counter and prescription medicines only as told by your doctor.  Ask your doctor if the medicine prescribed to you requires you to avoid driving or using machinery. Lifestyle  Do not use any products that contain nicotine or tobacco, such as cigarettes, e-cigarettes, and chewing tobacco. If you need help quitting, ask your doctor.  Do not drink alcohol.  Get 7-9 hours of sleep each night.  Lower the stress in your life. Ask your doctor about ways to do this.  Stay at a healthy weight. Talk with your doctor if you need help losing weight.  Get regular exercise.   General instructions  Keep a journal to find out if certain things bring on migraines. For example, write down: ? What you eat and drink. ? How   much sleep you get. ? Any change to your diet or medicines.  Lie down in a dark, quiet room when you have a migraine.  Try placing a cool towel over your head when you have a migraine.  Keep lights dim if bright lights bother you or make your migraines worse.  Keep all follow-up visits as told by your doctor. This is important.   Where to find more information  Coalition for Headache and Migraine Patients (CHAMP):  headachemigraine.org  American Migraine Foundation: americanmigrainefoundation.org  National Headache Foundation: headaches.org Contact a doctor if:  Medicine does not help your migraine.  Your pain keeps coming back. Get help right away if:  Your migraine becomes really bad and medicine does not help.  You have a stiff neck and fever.  You have trouble seeing.  Your muscles are weak or you lose control of them.  You lose your balance or have trouble walking.  You feel like you will faint or you faint.  You start having sudden, very bad headaches.  You have a seizure. Summary  A migraine headache is very bad, throbbing pain that is usually on one side of the head.  A chronic migraine is a migraine that happens 15 days in a month for more than 3 months.  Talk with your doctor about what things may bring on your migraines.  Lie down in a dark, quiet room when you have a migraine.  Keep a journal. This can help you find out if certain things make you have migraines. This information is not intended to replace advice given to you by your health care provider. Make sure you discuss any questions you have with your health care provider. Document Revised: 12/18/2019 Document Reviewed: 12/18/2019 Elsevier Patient Education  2021 Elsevier Inc.  

## 2020-12-28 NOTE — Assessment & Plan Note (Signed)
Check labs  Tylenol arthritis  

## 2020-12-29 ENCOUNTER — Encounter: Payer: Self-pay | Admitting: Family Medicine

## 2020-12-29 ENCOUNTER — Telehealth: Payer: Self-pay | Admitting: Family Medicine

## 2020-12-29 NOTE — Telephone Encounter (Signed)
Heather,  please send labs to [facility]  Yes this could be the cause of headaches and racing heart  I'm not so concerned about the b12---- esp since you had just taken one and the body gets rid of extra in the urine

## 2020-12-29 NOTE — Telephone Encounter (Signed)
I need to know who this person is? Are they a provider and if they have fax number please

## 2020-12-29 NOTE — Telephone Encounter (Signed)
Patient  would like all of her lab results from 12/28/2020  sent to [facility] her number is [phone]   Please Advice

## 2020-12-30 ENCOUNTER — Telehealth: Payer: Self-pay | Admitting: *Deleted

## 2020-12-30 LAB — RHEUMATOID FACTOR: Rheumatoid fact SerPl-aCnc: <14 IU/mL (ref <14)

## 2020-12-30 LAB — ANA: Anti Nuclear Antibody (ANA): NEGATIVE

## 2020-12-30 NOTE — Telephone Encounter (Signed)
Per referral 12/28/20 Dr. Etter Sjogren  gave upcoming apts. with calendar and welcome packet

## 2021-01-04 ENCOUNTER — Other Ambulatory Visit: Payer: 59

## 2021-01-06 ENCOUNTER — Other Ambulatory Visit: Payer: Self-pay

## 2021-01-06 ENCOUNTER — Encounter: Payer: Self-pay | Admitting: Cardiology

## 2021-01-06 ENCOUNTER — Ambulatory Visit (INDEPENDENT_AMBULATORY_CARE_PROVIDER_SITE_OTHER): Payer: 59 | Admitting: Cardiology

## 2021-01-06 VITALS — BP 126/74 | HR 71 | Ht 66.0 in | Wt 151.1 lb

## 2021-01-06 DIAGNOSIS — R002 Palpitations: Secondary | ICD-10-CM | POA: Diagnosis not present

## 2021-01-06 NOTE — Telephone Encounter (Signed)
Pt saw cardiology today --- they asked that her thyroid med be adjusted  Pt wanted thyroid tx by [facility] and labs have been faxed there ----- did they adjust her meds?

## 2021-01-06 NOTE — Patient Instructions (Signed)
  Testing/Procedures:  Your physician has requested that you have an echocardiogram. Echocardiography is a painless test that uses sound waves to create images of your heart. It provides your doctor with information about the size and shape of your heart and how well your heart's chambers and valves are working. This procedure takes approximately one hour. There are no restrictions for this procedure.[facility] [facility]-1 ST FLOOR IMAGING DEPARTMENT     Follow-Up: At [facility], you and your health needs are our priority.  As part of our continuing mission to provide you with exceptional heart care, we have created designated Provider Care Teams.  These Care Teams include your primary Cardiologist (physician) and Advanced Practice Providers (APPs -  Physician Assistants and Nurse Practitioners) who all work together to provide you with the care you need, when you need it.  We recommend signing up for the patient portal called "MyChart".  Sign up information is provided on this After Visit Summary.  MyChart is used to connect with patients for Virtual Visits (Telemedicine).  Patients are able to view lab/test results, encounter notes, upcoming appointments, etc.  Non-urgent messages can be sent to your provider as well.   To learn more about what you can do with MyChart, go to [email or web address].    Your next appointment:   6 month(s)  The format for your next appointment:   In Person  Provider:   Kirk Ruths, MD

## 2021-01-08 NOTE — Telephone Encounter (Signed)
Unfortunately ins will not just let us add mri c spine without evaluating the neck

## 2021-01-16 ENCOUNTER — Other Ambulatory Visit: Payer: 59

## 2021-01-21 ENCOUNTER — Ambulatory Visit: Payer: 59 | Admitting: Family Medicine

## 2021-01-22 ENCOUNTER — Encounter: Payer: Self-pay | Admitting: Family Medicine

## 2021-01-22 ENCOUNTER — Ambulatory Visit (HOSPITAL_BASED_OUTPATIENT_CLINIC_OR_DEPARTMENT_OTHER)
Admission: RE | Admit: 2021-01-22 | Discharge: 2021-01-22 | Disposition: A | Discharge status: Home / Self Care | Payer: 59 | Source: Ambulatory Visit | Attending: Family Medicine | Admitting: Family Medicine

## 2021-01-22 ENCOUNTER — Other Ambulatory Visit: Payer: Self-pay

## 2021-01-22 ENCOUNTER — Ambulatory Visit (INDEPENDENT_AMBULATORY_CARE_PROVIDER_SITE_OTHER): Payer: 59 | Admitting: Family Medicine

## 2021-01-22 VITALS — BP 100/70 | HR 69 | Temp 98.5°F | Resp 18 | Ht 66.0 in | Wt 155.8 lb

## 2021-01-22 DIAGNOSIS — M542 Cervicalgia: Secondary | ICD-10-CM | POA: Diagnosis not present

## 2021-01-22 NOTE — Patient Instructions (Signed)
Cervical Sprain A cervical sprain is a stretch or tear in one or more of the ligaments in the neck. Ligaments are the tissues that connect bones. Cervical sprains can range from mild to severe. Severe cervical sprains can cause the spinal bones (vertebrae) in the neck to be unstable. This can result in spinal cord damage and in serious nervous system problems. The time that it takes for a cervical sprain to heal depends on the cause and extent of the injury. Most cervical sprains heal in 4-6 weeks. What are the causes? Cervical sprains may be caused by trauma, such as an injury from a motor vehicle accident, a fall, or a sudden forward and backward whipping movement of the head and neck (whiplash injury). Mild cervical sprains may be caused by wear and tear over time. What increases the risk? The following factors may make you more likely to develop this condition:  Participating in activities that have a high risk of trauma to the neck. These include contact sports, auto racing, gymnastics, and diving.  Taking risks when driving or riding in a motor vehicle.  Osteoarthritis of the spine.  Poor strength and flexibility of the neck.  A previous neck injury.  Poor posture.  Spending long periods in certain positions that put stress on the neck, such as sitting at a computer for a long time. What are the signs or symptoms? Symptoms of this condition include:  Pain, soreness, stiffness, tenderness, swelling, or a burning sensation in the front, back, or sides of the neck, shoulders, or upper back.  Sudden tightening of neck muscles (spasms).  Limited ability to move the neck.  Headache.  Dizziness.  Nausea or vomiting.  Weakness, numbness, or tingling in a hand or an arm. Symptoms may develop right away after injury, or they may develop over a few days. In some cases, symptoms may go away with treatment and return (recur) over time. How is this diagnosed? This condition may be  diagnosed based on:  Your medical history.  Your symptoms.  Any recent injuries or known neck problems that you have, such as arthritis in the neck.  A physical exam.  Imaging tests, such as X-rays, MRI, and CT scan. How is this treated? This condition is treated by resting and icing the injured area and doing physical therapy exercises. Heat therapy may be used 2-3 days after the injury occurred if there is no swelling. Depending on the severity of your condition, treatment may also include:  Keeping your neck in place (immobilized) for periods of time. This may be done using: ? A cervical collar. This supports your chin and the back of your head. ? A cervical traction device. This is a sling that holds up your head. The device removes weight and pressure from your neck, and it may help to relieve pain.  Medicines that help to relieve pain and inflammation.  Medicines that help to relax your muscles (muscle relaxants).  Surgery. This is rare. Follow these instructions at home: Medicines  Take over-the-counter and prescription medicines only as told by your health care provider.  Ask your health care provider if the medicine prescribed to you: ? Requires you to avoid driving or using heavy machinery. ? Can cause constipation. You may need to take these actions to prevent or treat constipation:  Drink enough fluid to keep your urine pale yellow.  Take over-the-counter or prescription medicines.  Eat foods that are high in fiber, such as beans, whole grains, and fresh fruits   and vegetables.  Limit foods that are high in fat and processed sugars, such as fried or sweet foods.   If you have a cervical collar:  Wear the collar as told by your health care provider. Do not remove it unless told.  Ask before making any adjustments to your collar.  If you have long hair, keep it outside of the collar.  Ask your health care provider if you may remove the collar for cleaning and  bathing. If so: ? Follow instructions about how to remove it safely. ? Clean it by hand with mild soap and water and air-dry it completely. ? If your collar has removable pads, remove them every 1-2 days and wash them by hand with soap and water. Let them air-dry completely before putting them back in the collar.  Tell your health care provider if your skin under the collar has irritation or sores. Managing pain, stiffness, and swelling  If directed, use a cervical traction device as told.  If directed, put ice on the affected area. To do this: ? Put ice in a plastic bag. ? Place a towel between your skin and the bag. ? Leave the ice on for 20 minutes, 2-3 times a day.  If directed, apply heat to the affected area before you do your physical therapy or as often as told by your health care provider. Use the heat source that your health care provider recommends, such as a moist heat pack or a heating pad. ? Place a towel between your skin and the heat source. ? Leave the heat on for 20-30 minutes. ? Remove the heat if your skin turns bright red. This is especially important if you are unable to feel pain, heat, or cold. You may have a greater risk of getting burned.      Activity  Do not drive while wearing a cervical collar. If you do not have a cervical collar, ask if it is safe to drive while your neck heals.  Do not lift anything that is heavier than 10 lb (4.5 kg), or the limit that you are told, until your health care provider says that it is safe.  Rest as told by your health care provider.  If physical therapy was prescribed, do exercises as told by your health care provider or physical therapist.  Return to your normal activities as told by your health care provider. Avoid positions and activities that make your symptoms worse. Ask your health care provider what activities are safe for you. General instructions  Do not use any products that contain nicotine or tobacco, such  as cigarettes, e-cigarettes, and chewing tobacco. These can delay healing. If you need help quitting, ask your health care provider.  Keep all follow-up visits as told by your health care provider or physical therapist. This is important. How is this prevented? To prevent a cervical sprain from happening again:  Use and maintain good posture. Make any needed adjustments to your workstation to help you do this.  Exercise regularly as told by your health care provider or physical therapist.  Avoid risky activities that may cause a cervical sprain. Contact a health care provider if you have:  Symptoms that get worse or do not get better after 2 weeks of treatment.  Pain that gets worse or does not get better with medicine.  New, unexplained symptoms.  Sores or irritated skin on your neck from wearing your cervical collar. Get help right away if:  You have severe pain.    You develop numbness, tingling, or weakness in any part of your body.  You cannot move a part of your body (you have paralysis).  You have neck pain along with severe dizziness or headache. Summary  A cervical sprain is a stretch or tear in one or more of the ligaments in the neck.  Cervical sprains may be caused by trauma, such as an injury from a motor vehicle accident, a fall, or a sudden forward and backward whipping movement of the head and neck (whiplash injury).  Symptoms may develop right away after injury, or they may develop over a few days.  This condition may be treated with rest, ice, heat, medicines, physical therapy, and surgery. This information is not intended to replace advice given to you by your health care provider. Make sure you discuss any questions you have with your health care provider. Document Revised: 07/10/2019 Document Reviewed: 07/10/2019 Elsevier Patient Education  2021 [employer]vier Inc.  

## 2021-01-22 NOTE — Assessment & Plan Note (Signed)
Check xray Consider pt / MRI

## 2021-01-22 NOTE — Progress Notes (Signed)
Patient ID: Gina Pineda, female    DOB: [birth year]  Age: 50 y.o. MRN: [id number]    Subjective:  Subjective  HPI Gina Pineda presents for office visit today.   She has had chronic headaches in the past. Her ENT recently dx with migraines. She is still having headaches in the occipital aspect of her head, numbness and tingling in her UE, weakness in hands. Denies dropping any objects. She is request for a MRI secondary to chronic head pain.   She reports that her Thyroid levels have improved and she was recommended to complete a hormone test. her Cardiology recommended that she buys an apple watch. She denies any chest pain, SOB, fever, cough, chills, back pain, sore throat, urinary problems, or vaginal pain.    Review of Systems  Constitutional: Negative for chills, fatigue and fever.  HENT: Negative for congestion, ear pain, rhinorrhea, sinus pressure, sinus pain and sore throat.   Eyes: Negative for pain.  Respiratory: Negative for cough, chest tightness and shortness of breath.   Cardiovascular: Negative for chest pain, palpitations and leg swelling.  Gastrointestinal: Negative for abdominal pain, blood in stool, diarrhea, nausea and vomiting.  Genitourinary: Negative for difficulty urinating, dysuria, flank pain, hematuria, vaginal bleeding, vaginal discharge and vaginal pain.  Musculoskeletal: Negative for back pain and neck pain.  Skin: Negative for rash.  Neurological: Negative for dizziness and headaches.    History Past Medical History:  Diagnosis Date  . Anxiety   . Ovarian cyst 08/2015  . Thyroid disease    Hypothyroidism    She has a past surgical history that includes Cesarean section; Nasal septum surgery; Intrauterine device insertion; and Breast biopsy.   Her family history includes Breast cancer in an other family member; Cancer (age of onset: 31) in her maternal grandmother; Diabetes in her maternal grandfather; Hyperlipidemia in her father; Hypertension  in her father.She reports that she has never smoked. She has never used smokeless tobacco. She reports current alcohol use. She reports that she does not use drugs.  Current Outpatient Medications on File Prior to Visit  Medication Sig Dispense Refill  . cholecalciferol (VITAMIN D3) 25 MCG (1000 UT) tablet Take 1,000 Units by mouth daily.    . fluticasone (FLONASE) 50 MCG/ACT nasal spray Place 2 sprays into both nostrils daily. 16 g 6  . MAGNESIUM CITRATE PO Take 1 tablet by mouth daily.     Marland Kitchen NALTREXONE HCL PO Take 3 mg by mouth daily.    Marland Kitchen thyroid (ARMOUR) 90 MG tablet Take 90 mg by mouth daily.    . vitamin k 100 MCG tablet Take 100 mcg by mouth daily.     No current facility-administered medications on file prior to visit.     Objective:  Objective  Physical Exam Vitals and nursing note reviewed.  Constitutional:      General: She is not in acute distress.    Appearance: She is well-developed. She is not ill-appearing.  HENT:     Head: Normocephalic and atraumatic.     Nose: Nose normal.  Eyes:     General:        Right eye: No discharge.        Left eye: No discharge.     Extraocular Movements: Extraocular movements intact.     Pupils: Pupils are equal, round, and reactive to light.  Cardiovascular:     Rate and Rhythm: Normal rate and regular rhythm.     Heart sounds: No murmur heard.  Pulmonary:     Effort: Pulmonary effort is normal.     Breath sounds: Normal breath sounds.  Abdominal:     General: Bowel sounds are normal.     Palpations: Abdomen is soft.     Tenderness: There is no abdominal tenderness.  Musculoskeletal:        General: Normal range of motion.     Cervical back: Normal range of motion and neck supple.  Skin:    General: Skin is warm and dry.  Neurological:     Mental Status: She is alert and oriented to person, place, and time.  Psychiatric:        Behavior: Behavior normal.    BP 100/70 (BP Location: Right Arm, Patient Position:  Sitting, Cuff Size: Normal)   Pulse 69   Temp 98.5 F (36.9 C) (Oral)   Resp 18   Ht 5\' 6"  (1.676 m)   Wt 155 lb 12.8 oz (70.7 kg)   SpO2 99%   BMI 25.15 kg/m  Wt Readings from Last 3 Encounters:  01/22/21 155 lb 12.8 oz (70.7 kg)  01/06/21 151 lb 1.6 oz (68.5 kg)  12/28/20 146 lb 3.2 oz (66.3 kg)     Lab Results  Component Value Date   WBC 4.4 12/28/2020   HGB 14.6 12/28/2020   HCT 42.8 12/28/2020   PLT 212.0 12/28/2020   GLUCOSE 111 (H) 12/28/2020   CHOL 144 10/02/2018   TRIG 40.0 10/02/2018   HDL 50.80 10/02/2018   LDLCALC 85 10/02/2018   ALT 10 12/28/2020   AST 13 12/28/2020   NA 138 12/28/2020   K 4.0 12/28/2020   CL 101 12/28/2020   CREATININE 0.65 12/28/2020   BUN 11 12/28/2020   CO2 27 12/28/2020   TSH 0.16 (L) 12/28/2020   INR 1.1 04/16/2020   HGBA1C 5.5 12/07/2009    CT HEAD WO CONTRAST  Result Date: 04/16/2020 CLINICAL DATA:  50 year old female with headache and dizziness. EXAM: CT HEAD WITHOUT CONTRAST TECHNIQUE: Contiguous axial images were obtained from the base of the skull through the vertex without intravenous contrast. COMPARISON:  None. FINDINGS: Brain: The ventricles and sulci appropriate size for patient's age. The gray-white matter discrimination is preserved. There is no acute intracranial hemorrhage. No mass effect or midline shift. No extra-axial fluid collection. Vascular: No hyperdense vessel or unexpected calcification. Skull: Normal. Negative for fracture or focal lesion. Sinuses/Orbits: The visualized paranasal sinuses and mastoid air cells are clear. Other: None IMPRESSION: Unremarkable noncontrast CT of the brain. Electronically Signed   By: Anner Crete M.D.   On: 04/16/2020 23:15     Assessment & Plan:  Plan    No orders of the defined types were placed in this encounter.   Problem List Items Addressed This Visit   None   Visit Diagnoses    Neck pain    -  Primary   Relevant Orders   DG Cervical Spine Complete   MR  Cervical Spine Wo Contrast      Follow-up: Return if symptoms worsen or fail to improve.   I,Alexis Bryant,acting as a Education administrator for [employer], DO.,have documented all relevant documentation on the behalf of Ann Held, DO,as directed by  Ann Held, DO while in the presence of Murphy, DO, have reviewed all documentation for this visit. The documentation on 01/22/21 for the exam, diagnosis, procedures, and orders are all accurate and complete.

## 2021-01-25 ENCOUNTER — Telehealth: Payer: Self-pay | Admitting: *Deleted

## 2021-01-25 NOTE — Telephone Encounter (Signed)
Patient called to cancel appointment and will call back to reschedule.Gina Pineda

## 2021-01-27 ENCOUNTER — Inpatient Hospital Stay: Payer: 59 | Admitting: Hematology & Oncology

## 2021-01-27 ENCOUNTER — Inpatient Hospital Stay: Payer: 59

## 2021-01-29 ENCOUNTER — Ambulatory Visit (HOSPITAL_BASED_OUTPATIENT_CLINIC_OR_DEPARTMENT_OTHER)
Admission: RE | Admit: 2021-01-29 | Discharge: 2021-01-29 | Disposition: A | Discharge status: Home / Self Care | Payer: 59 | Source: Ambulatory Visit | Attending: Cardiology | Admitting: Cardiology

## 2021-01-29 ENCOUNTER — Other Ambulatory Visit: Payer: Self-pay

## 2021-01-29 DIAGNOSIS — I10 Essential (primary) hypertension: Secondary | ICD-10-CM

## 2021-01-29 DIAGNOSIS — R002 Palpitations: Secondary | ICD-10-CM | POA: Diagnosis not present

## 2021-01-29 LAB — ECHOCARDIOGRAM COMPLETE
Area-P 1/2: 2.62 cm2
S' Lateral: 2.69 cm

## 2021-02-02 ENCOUNTER — Encounter: Payer: Self-pay | Admitting: *Deleted

## 2021-02-02 ENCOUNTER — Other Ambulatory Visit: Payer: Self-pay | Admitting: Family Medicine

## 2021-02-02 DIAGNOSIS — M5030 Other cervical disc degeneration, unspecified cervical region: Secondary | ICD-10-CM

## 2021-02-09 ENCOUNTER — Encounter: Payer: Self-pay | Admitting: Family Medicine

## 2021-02-09 ENCOUNTER — Other Ambulatory Visit: Payer: Self-pay

## 2021-02-09 ENCOUNTER — Ambulatory Visit
Admission: RE | Admit: 2021-02-09 | Discharge: 2021-02-09 | Disposition: A | Discharge status: Home / Self Care | Payer: 59 | Source: Ambulatory Visit | Attending: Family Medicine | Admitting: Family Medicine

## 2021-02-09 ENCOUNTER — Other Ambulatory Visit: Payer: 59

## 2021-02-09 DIAGNOSIS — Z862 Personal history of diseases of the blood and blood-forming organs and certain disorders involving the immune mechanism: Secondary | ICD-10-CM

## 2021-02-09 DIAGNOSIS — R519 Headache, unspecified: Secondary | ICD-10-CM

## 2021-02-09 NOTE — Telephone Encounter (Signed)
Ask gwen or sherri but I believe her ins would not cover it--- I think they said she needed to try PT first ---- I don't know why that is not in the chart  I'm pretty sure one of them talked to me about it --- I remember typing a note about it asking if she wanted pt but I dont see it --- i'll send this to them to to see if that is what happened

## 2021-02-11 NOTE — Telephone Encounter (Signed)
I do not believe it is anything to worry ---- I would just recommend neuro for headaches if they are worsening  There is [facility] [place]

## 2021-02-24 ENCOUNTER — Other Ambulatory Visit: Payer: 59

## 2021-02-24 ENCOUNTER — Inpatient Hospital Stay: Payer: 59 | Admitting: Hematology & Oncology

## 2021-02-26 ENCOUNTER — Telehealth: Payer: Self-pay | Admitting: *Deleted

## 2021-02-26 NOTE — Telephone Encounter (Signed)
After several attempts trying to schedule and rescheduling and cancellations, the referral has been closed.

## 2021-03-16 ENCOUNTER — Other Ambulatory Visit: Payer: Self-pay

## 2021-03-16 ENCOUNTER — Encounter: Payer: Self-pay | Admitting: Family Medicine

## 2021-03-16 DIAGNOSIS — R519 Headache, unspecified: Secondary | ICD-10-CM

## 2021-03-16 MED ORDER — SUMATRIPTAN SUCCINATE 50 MG PO TABS: 50.0000 mg | ORAL_TABLET | ORAL | 0 refills | Status: DC | PRN

## 2021-03-16 NOTE — Telephone Encounter (Signed)
Please advise 

## 2021-03-16 NOTE — Telephone Encounter (Signed)
If they are only 1 x a month we could try immitrex 50 mg #10 1 po x1,  may repeat in 2 hours x 1 prn  She should take it at the first sign of headache.    If they are occurring more often than that then we should discuss preventative

## 2021-03-22 ENCOUNTER — Ambulatory Visit: Payer: 59 | Admitting: Family

## 2021-03-22 ENCOUNTER — Other Ambulatory Visit: Payer: 59

## 2021-04-13 ENCOUNTER — Other Ambulatory Visit: Payer: Self-pay | Admitting: Family Medicine

## 2021-04-13 DIAGNOSIS — R519 Headache, unspecified: Secondary | ICD-10-CM

## 2021-07-24 ENCOUNTER — Other Ambulatory Visit: Payer: Self-pay | Admitting: Family Medicine

## 2021-07-24 DIAGNOSIS — R519 Headache, unspecified: Secondary | ICD-10-CM

## 2021-07-26 MED ORDER — SUMATRIPTAN SUCCINATE 50 MG PO TABS: 50.0000 mg | ORAL_TABLET | ORAL | 0 refills | Status: DC | PRN

## 2021-07-27 ENCOUNTER — Ambulatory Visit: Payer: 59 | Admitting: Family Medicine

## 2021-07-30 ENCOUNTER — Other Ambulatory Visit: Payer: Self-pay | Admitting: Physician Assistant

## 2021-07-30 DIAGNOSIS — R109 Unspecified abdominal pain: Secondary | ICD-10-CM

## 2021-07-30 DIAGNOSIS — R634 Abnormal weight loss: Secondary | ICD-10-CM

## 2021-09-23 ENCOUNTER — Inpatient Hospital Stay (HOSPITAL_BASED_OUTPATIENT_CLINIC_OR_DEPARTMENT_OTHER): Payer: 59 | Admitting: Hematology & Oncology

## 2021-09-23 ENCOUNTER — Other Ambulatory Visit: Payer: Self-pay

## 2021-09-23 ENCOUNTER — Encounter: Payer: Self-pay | Admitting: Hematology & Oncology

## 2021-09-23 ENCOUNTER — Inpatient Hospital Stay: Payer: 59 | Attending: Hematology & Oncology

## 2021-09-23 VITALS — BP 154/96 | HR 74 | Temp 98.7°F | Resp 18 | Ht 66.5 in | Wt 147.0 lb

## 2021-09-23 DIAGNOSIS — M791 Myalgia, unspecified site: Secondary | ICD-10-CM | POA: Diagnosis not present

## 2021-09-23 DIAGNOSIS — D6861 Antiphospholipid syndrome: Secondary | ICD-10-CM | POA: Insufficient documentation

## 2021-09-23 DIAGNOSIS — M2550 Pain in unspecified joint: Secondary | ICD-10-CM | POA: Diagnosis not present

## 2021-09-23 DIAGNOSIS — Z8616 Personal history of COVID-19: Secondary | ICD-10-CM | POA: Diagnosis not present

## 2021-09-23 DIAGNOSIS — R911 Solitary pulmonary nodule: Secondary | ICD-10-CM | POA: Diagnosis not present

## 2021-09-23 DIAGNOSIS — E039 Hypothyroidism, unspecified: Secondary | ICD-10-CM | POA: Diagnosis not present

## 2021-09-23 DIAGNOSIS — Z79899 Other long term (current) drug therapy: Secondary | ICD-10-CM | POA: Insufficient documentation

## 2021-09-23 DIAGNOSIS — A6920 Lyme disease, unspecified: Secondary | ICD-10-CM | POA: Diagnosis not present

## 2021-09-23 DIAGNOSIS — F419 Anxiety disorder, unspecified: Secondary | ICD-10-CM | POA: Insufficient documentation

## 2021-09-23 DIAGNOSIS — R531 Weakness: Secondary | ICD-10-CM | POA: Diagnosis not present

## 2021-09-23 LAB — CBC WITH DIFFERENTIAL (CANCER CENTER ONLY)
Abs Immature Granulocytes: 0.01 10*3/uL (ref 0.00–0.07)
Basophils Absolute: 0 10*3/uL (ref 0.0–0.1)
Basophils Relative: 1 %
Eosinophils Absolute: 0 10*3/uL (ref 0.0–0.5)
Eosinophils Relative: 1 %
HCT: 42.6 % (ref 36.0–46.0)
Hemoglobin: 14.5 g/dL (ref 12.0–15.0)
Immature Granulocytes: 0 %
Lymphocytes Relative: 28 %
Lymphs Abs: 1.4 10*3/uL (ref 0.7–4.0)
MCH: 31.5 pg (ref 26.0–34.0)
MCHC: 34 g/dL (ref 30.0–36.0)
MCV: 92.6 fL (ref 80.0–100.0)
Monocytes Absolute: 0.4 10*3/uL (ref 0.1–1.0)
Monocytes Relative: 7 %
Neutro Abs: 3 10*3/uL (ref 1.7–7.7)
Neutrophils Relative %: 63 %
Platelet Count: 213 10*3/uL (ref 150–400)
RBC: 4.6 MIL/uL (ref 3.87–5.11)
RDW: 12.5 % (ref 11.5–15.5)
WBC Count: 4.9 10*3/uL (ref 4.0–10.5)
nRBC: 0 % (ref 0.0–0.2)

## 2021-09-23 LAB — CMP (CANCER CENTER ONLY)
ALT: 10 U/L (ref 0–44)
AST: 13 U/L — ABNORMAL LOW (ref 15–41)
Albumin: 4.7 g/dL (ref 3.5–5.0)
Alkaline Phosphatase: 53 U/L (ref 38–126)
Anion gap: 8 (ref 5–15)
BUN: 11 mg/dL (ref 6–20)
CO2: 28 mmol/L (ref 22–32)
Calcium: 10 mg/dL (ref 8.9–10.3)
Chloride: 102 mmol/L (ref 98–111)
Creatinine: 0.73 mg/dL (ref 0.44–1.00)
GFR, Estimated: >60 mL/min (ref >60)
Glucose, Bld: 111 mg/dL — ABNORMAL HIGH (ref 70–99)
Potassium: 3.9 mmol/L (ref 3.5–5.1)
Sodium: 138 mmol/L (ref 135–145)
Total Bilirubin: 0.8 mg/dL (ref 0.3–1.2)
Total Protein: 7.1 g/dL (ref 6.5–8.1)

## 2021-09-23 NOTE — Progress Notes (Signed)
Referral MD  Reason for Referral: History of antiphospholipid antibody syndrome  Chief Complaint  Patient presents with   New Patient (Initial Visit)  : I just do not feel well.  I had COVID and I had Lyme disease.  HPI: Gina Pineda is a really nice 49 year old white female.  She is originally from [place].  I really enjoyed talking to her.  She went to the [employer].  We talked about that.  She got married.  She moved up to [place].  While up in [place], she had a couple miscarriages.  She said these were after 12 weeks.  She was told that she had the antiphospholipid antibody syndrome.  However, after this, she had 2 pregnancies that she carried to term.  She was on no anticoagulation.  She thinks that she had Lyme disease while up there.  She was being hospitalized for a while.  However she said the Lyme disease test was negative.  Down here in [place], she was tested again and was found to be positive.  She had COVID in January.  She had bad migraines.  She has had history of migraines.  She just has not felt well.  She says she has arthralgias and myalgias.  She gets weak.  She does not have a lot of stamina..  She has been seen by multiple specialist.  She has been seen by neurology, cardiology, rheumatology, gastroenterology.  So far, nobody can really figure out what is going on.  Of note, she had a CT of the abdomen pelvis back in 2020.  This showed a 4 mm left lower lobe lung nodule.  She has never smoked.  This definitely needs to be followed up.  She had an echocardiogram in March 2022.  This looked okay.  There are no valvular issues.  Her ejection fraction was 60-65%.  She has no cough.  Has been no obvious change in bowel or bladder habits.  She has had no rashes.  Think she has lost some weight.  She does see [facility] for her thyroid.  She really enjoys going to them.  They appear to be doing a wonderful job trying to manage her thyroid.  She is a  massage therapist.  She really has a hard time doing this because of fatigue and decreased strength.  She was referred to the [facility] to see if we might be able to help her out and to see if this APS that she is reported to have is an issue.  As far as I can tell, she has had no obvious thromboembolic disease systemically.  Again, she does not smoke.  I do not think she really has alcoholic beverages.  There is no one in the family that has had any problems with thromboembolic disease.  Currently, I would have said that her performance status is ECOG 1.  Past Medical History:  Diagnosis Date   Anxiety    Ovarian cyst 08/2015   Thyroid disease    Hypothyroidism  :   Past Surgical History:  Procedure Laterality Date   BREAST BIOPSY     CESAREAN SECTION     INTRAUTERINE DEVICE INSERTION     NASAL SEPTUM SURGERY    :   Current Outpatient Medications:    ARMOUR THYROID PO, Take 75 mg by mouth., Disp: , Rfl:    cholecalciferol (VITAMIN D3) 25 MCG (1000 UT) tablet, Take 1,000 Units by mouth daily., Disp: , Rfl:  NALTREXONE HCL PO, Take 3 mg by mouth daily., Disp: , Rfl:    fluticasone (FLONASE) 50 MCG/ACT nasal spray, Place 2 sprays into both nostrils daily., Disp: 16 g, Rfl: 6   MAGNESIUM CITRATE PO, Take 1 tablet by mouth daily. , Disp: , Rfl:    SUMAtriptan (IMITREX) 50 MG tablet, Take 1 tablet (50 mg total) by mouth every 2 (two) hours as needed for migraine. May repeat in 2 hours if headache persists or recurs. (Patient not taking: Reported on 09/23/2021), Disp: 10 tablet, Rfl: 0   thyroid (ARMOUR) 90 MG tablet, Take 90 mg by mouth daily., Disp: , Rfl:    vitamin k 100 MCG tablet, Take 100 mcg by mouth daily. (Patient not taking: Reported on 09/23/2021), Disp: , Rfl: :  :   Allergies  Allergen Reactions   Potassium-Containing Compounds Swelling  :   Family History  Problem Relation Age of Onset   Diabetes Maternal Grandfather    Hypertension  Father    Hyperlipidemia Father    Breast cancer Other    Cancer Maternal Grandmother 53       breast  :   Social History   Socioeconomic History   Marital status: Married    Spouse name: Not on file   Number of children: 2   Years of education: Not on file   Highest education level: Not on file  Occupational History   Occupation: massage therapist    Employer: [employer]  Tobacco Use   Smoking status: Never   Smokeless tobacco: Never  Vaping Use   Vaping Use: Never used  Substance and Sexual Activity   Alcohol use: Yes    Comment: 4-5 drinks/ month   Drug use: No   Sexual activity: Yes    Partners: Male  Other Topics Concern   Not on file  Social History Narrative   GETS REG EXERCISE         Social Determinants of Health   Financial Resource Strain: Not on file  Food Insecurity: Not on file  Transportation Needs: Not on file  Physical Activity: Not on file  Stress: Not on file  Social Connections: Not on file  Intimate Partner Violence: Not on file  :  Review of Systems  Constitutional:  Positive for malaise/fatigue and weight loss.  HENT: Negative.    Eyes: Negative.   Respiratory: Negative.    Cardiovascular: Negative.   Gastrointestinal: Negative.   Genitourinary: Negative.   Musculoskeletal:  Positive for myalgias.  Skin: Negative.   Neurological:  Positive for headaches.  Endo/Heme/Allergies: Negative.   Psychiatric/Behavioral: Negative.      Exam: @IPVITALS @ This is a well-developed well-nourished white female in no obvious distress.  Vital signs show a temperature of 98.7.  Pulse 74.  Blood pressure 154/96 and weight is 147 pounds.  Head and neck exam shows no ocular or oral lesions.  She has no adenopathy in the neck.  Lungs are clear to percussion and auscultation bilaterally.  Cardiac exam regular rate and rhythm with no murmurs, rubs or bruits.  Axillary exam shows no bilateral axillary adenopathy.  Abdomen is soft.  She has good  bowel sounds.  There is no fluid wave.  There is no palpable liver or spleen tip.  Back exam shows no tenderness over the spine, ribs or hips.  Skin exam does show some fair skin.  She has several hyperpigmented lesions.  I do not see anything that looks suspicious.  Extremity shows no clubbing, cyanosis or  edema.  She has good range of motion of her joints.  She has decent, symmetric strength in the upper and lower extremities.  Neurological exam shows no focal neurological deficits.   Recent Labs    09/23/21 1054  WBC 4.9  HGB 14.5  HCT 42.6  PLT 213    Recent Labs    09/23/21 1054  NA 138  K 3.9  CL 102  CO2 28  GLUCOSE 111*  BUN 11  CREATININE 0.73  CALCIUM 10.0    Blood smear review: Normochromic and normocytic population of red blood cells.  She has no nucleated red blood cells.  There are no schistocytes.  She has no teardrop cells.  There are no rouleaux formation.  White blood cells appear normal in morphology and maturation.  There is no immature myeloid or lymphoid cells.  She has no hypersegmented polys.  Platelets are adequate number and size.  Platelets are well granulated.  Pathology: None    Assessment and Plan: Gina Pineda is a very charming 50 year old white female.  She certainly appears to have some kind of rheumatologic issue to me.  She has had COVID and Lyme disease.  I would have to think that a lot of her symptoms might be chronic and hopefully not long-term issues with these diseases.  I realize that we are learning more about long-term COVID issues.  It certainly seems as if she is having long-term COVID issues.  However, she says a lot of her symptoms are before she had COVID.  As such, I will know this could be some type of autoimmune issue from the Lyme disease.  I do not see anything that would suggest that the APS is causing problems.  Typically, the APS causes issues secondary to thromboembolic events.  However, I am worried about this nodule in  the lung.  Is been 2 years since she had a CT scan.  We will set her up with a CT scan today to see if we can see any growth.  If there is growth, then she will deftly need to have this removed.  I realize that she has never smoked but we are seeing more bronchogenic carcinoma in non-smokers.  I am checking her APS levels.  I will have to see what they look like.  She is delightful to talk to.  I just wish we could give her some to make her feel better.  I do not see anything that we can do that could improve her situation.  I am unsure of if there is anything that can be given that can help with some of these symptoms that she has.  I will plan to see her back once we get the APS levels back and see what the CT scan shows.

## 2021-09-24 LAB — BETA-2-GLYCOPROTEIN I ABS, IGG/M/A
Beta-2 Glyco I IgG: <9 GPI IgG units (ref 0–20)
Beta-2-Glycoprotein I IgA: <9 GPI IgA units (ref 0–25)
Beta-2-Glycoprotein I IgM: <9 GPI IgM units (ref 0–32)

## 2021-09-25 LAB — LUPUS ANTICOAGULANT PANEL
DRVVT: 34.3 s (ref 0.0–47.0)
PTT Lupus Anticoagulant: 34.6 s (ref 0.0–51.9)

## 2021-09-28 ENCOUNTER — Other Ambulatory Visit: Payer: Self-pay

## 2021-09-28 ENCOUNTER — Ambulatory Visit (HOSPITAL_BASED_OUTPATIENT_CLINIC_OR_DEPARTMENT_OTHER)
Admission: RE | Admit: 2021-09-28 | Discharge: 2021-09-28 | Disposition: A | Discharge status: Home / Self Care | Payer: 59 | Source: Ambulatory Visit | Attending: Hematology & Oncology | Admitting: Hematology & Oncology

## 2021-09-28 DIAGNOSIS — R911 Solitary pulmonary nodule: Secondary | ICD-10-CM | POA: Insufficient documentation

## 2021-09-28 LAB — CARDIOLIPIN ANTIBODIES, IGG, IGM, IGA
Anticardiolipin IgA: <9 APL U/mL (ref 0–11)
Anticardiolipin IgG: <9 GPL U/mL (ref 0–14)
Anticardiolipin IgM: 18 MPL U/mL — ABNORMAL HIGH (ref 0–12)

## 2021-09-29 ENCOUNTER — Encounter: Payer: Self-pay | Admitting: *Deleted

## 2021-10-03 ENCOUNTER — Other Ambulatory Visit: Payer: Self-pay | Admitting: Family Medicine

## 2022-01-13 ENCOUNTER — Other Ambulatory Visit: Payer: Self-pay | Admitting: Family Medicine

## 2022-01-13 DIAGNOSIS — R519 Headache, unspecified: Secondary | ICD-10-CM

## 2022-03-03 ENCOUNTER — Encounter: Payer: Self-pay | Admitting: Family Medicine

## 2022-03-04 ENCOUNTER — Other Ambulatory Visit: Payer: Self-pay | Admitting: Family Medicine

## 2022-03-04 DIAGNOSIS — G43809 Other migraine, not intractable, without status migrainosus: Secondary | ICD-10-CM

## 2022-03-04 NOTE — Telephone Encounter (Signed)
Please advise. Pt has not been seen in over a year.  ?

## 2022-03-07 ENCOUNTER — Other Ambulatory Visit: Payer: Self-pay | Admitting: Family Medicine

## 2022-03-07 DIAGNOSIS — G43909 Migraine, unspecified, not intractable, without status migrainosus: Secondary | ICD-10-CM

## 2022-08-25 ENCOUNTER — Other Ambulatory Visit: Payer: Self-pay | Admitting: Family Medicine

## 2022-08-25 DIAGNOSIS — R519 Headache, unspecified: Secondary | ICD-10-CM

## 2022-08-30 IMAGING — MR MR HEAD W/O CM
10 series · 48 of 48 positions shown · non-contrast
Comparison: Prior head CT 04/16/2020.

CLINICAL DATA: Frequent headaches. History of antiphospholipid
antibody syndrome. Headache, intracranial hemorrhage suspected.

EXAM:
MRI HEAD WITHOUT CONTRAST
TECHNIQUE: Multiplanar, multiecho pulse sequences of the brain and surrounding
structures were obtained without intravenous contrast.

[Series 2: T1 · sagittal · 5.0mm · 0.45mm/px · 3 of 21 slices shown]
[im 1/21]
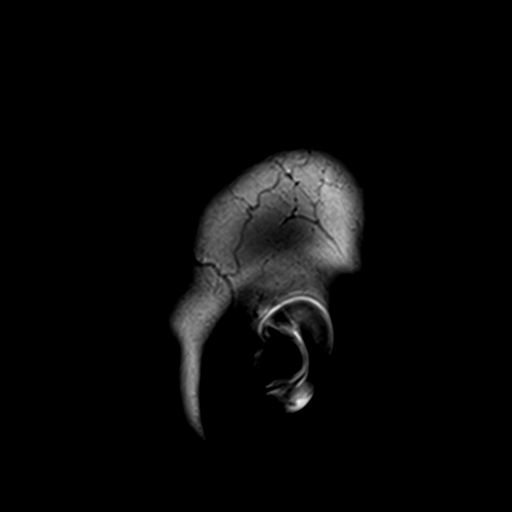
[im 11/21]
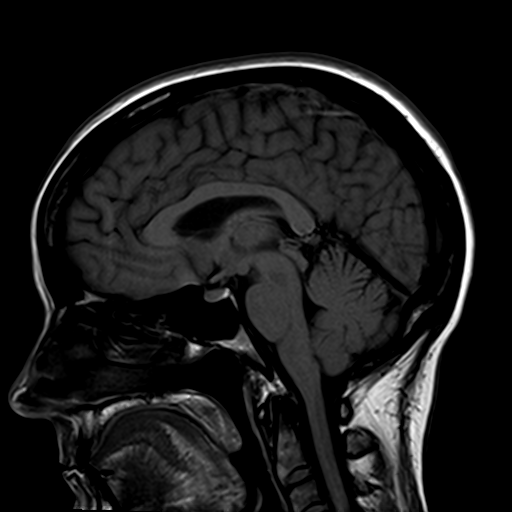
[im 21/21]
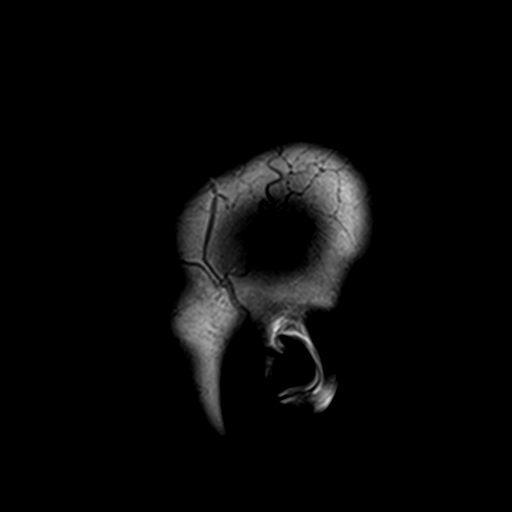

[Series 3: DWI · axial · 3.0mm · 1.80mm/px · z∈[-40,+106]mm · 9 of 100 slices shown (1 of 4)]
[im 1/100]
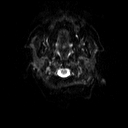
[im 13/100]
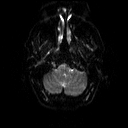
[im 25/100]
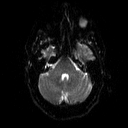
[im 38/100]
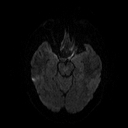
[im 50/100]
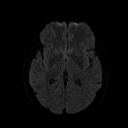
[im 62/100]
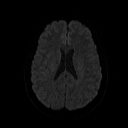
[im 75/100]
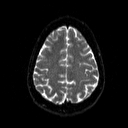
[im 87/100]
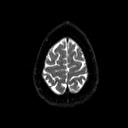
[im 100/100]
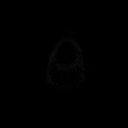

[Series 4: DWI · axial · 3.0mm · 1.80mm/px · z∈[-40,+106]mm · 4 of 48 slices shown (2 of 4)]
[im 1/48]
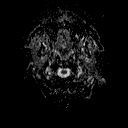
[im 16/48]
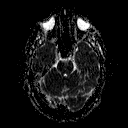
[im 32/48]
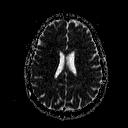
[im 48/48]
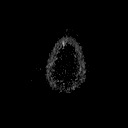

[Series 5: DWI · coronal · 5.0mm · 1.80mm/px · 6 of 68 slices shown (3 of 4)]
[im 1/68]
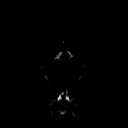
[im 14/68]
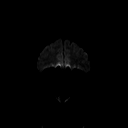
[im 27/68]
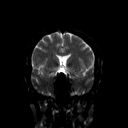
[im 41/68]
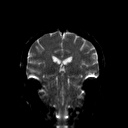
[im 54/68]
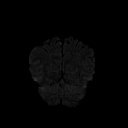
[im 68/68]
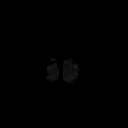

[Series 6: DWI · coronal · 5.0mm · 1.80mm/px · 3 of 34 slices shown (4 of 4)]
[im 1/34]
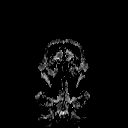
[im 17/34]
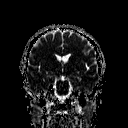
[im 34/34]
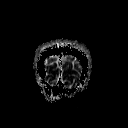

[Series 7: T2 · axial · 5.0mm · 0.51mm/px · z∈[-40,+106]mm · 2 of 22 slices shown (1 of 2)]
[im 1/22]
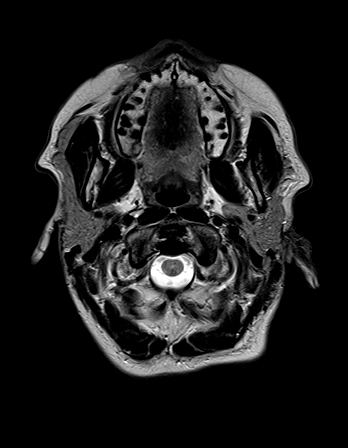
[im 22/22]
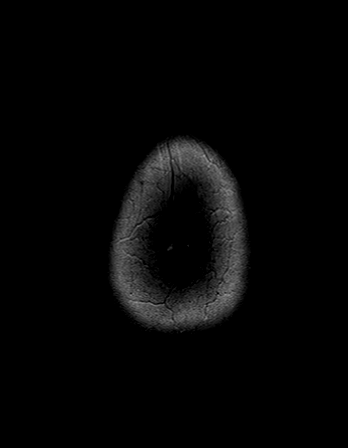

[Series 8: FLAIR · axial · 3.0mm · 0.45mm/px · z∈[-35,+99]mm · 3 of 30 slices shown]
[im 1/30]
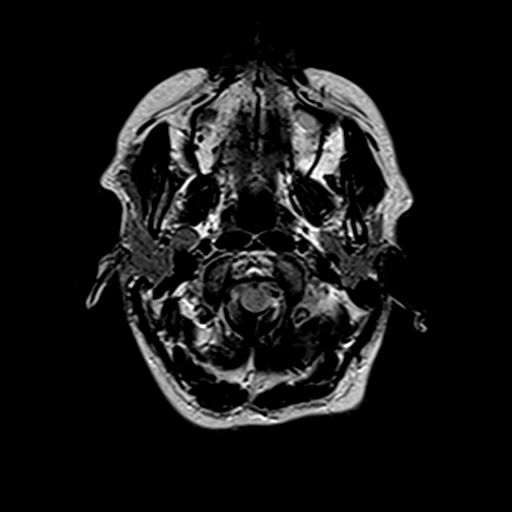
[im 15/30]
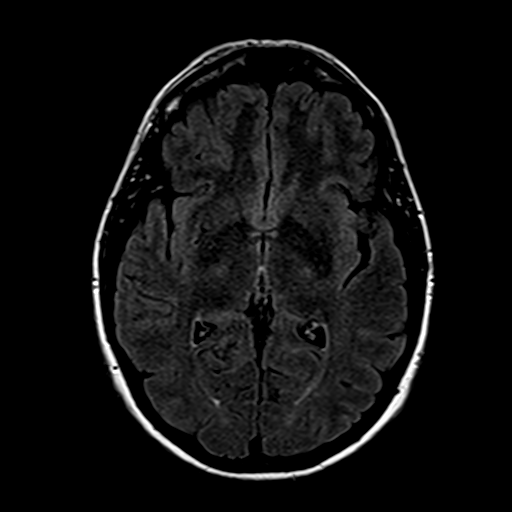
[im 30/30]
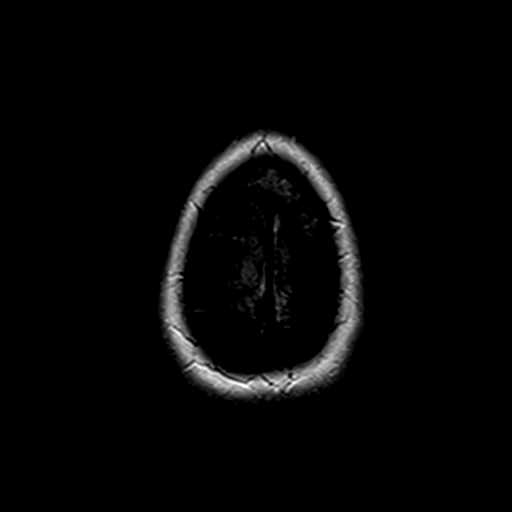

[Series 10: swi_images · axial · 4.0mm · 0.90mm/px · z∈[-38,+101]mm · 3 of 36 slices shown]
[im 1/36]
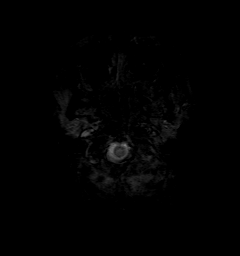
[im 18/36]
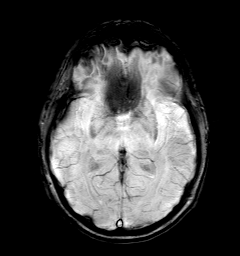
[im 36/36]
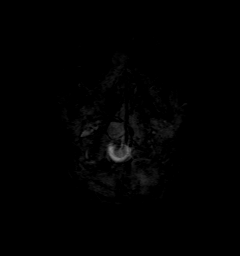

[Series 11: t1_mpr_tra · axial · 1.0mm · 0.71mm/px · z∈[-38,+104]mm · 13 of 144 slices shown]
[im 1/144]
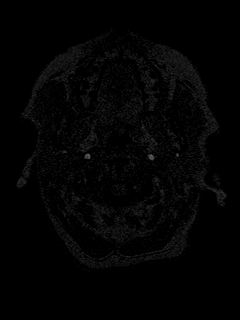
[im 12/144]
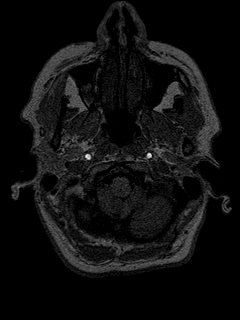
[im 24/144]
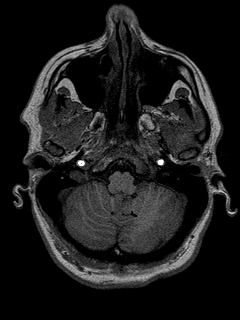
[im 36/144]
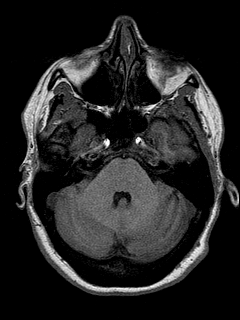
[im 48/144]
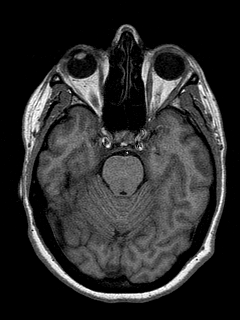
[im 60/144]
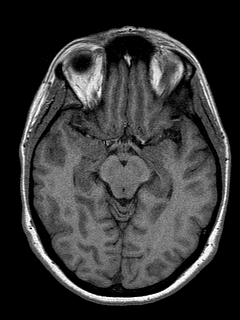
[im 72/144]
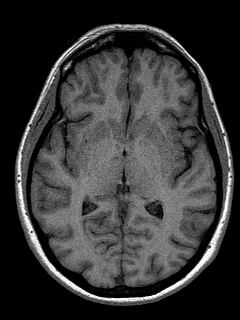
[im 84/144]
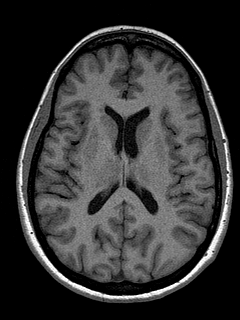
[im 96/144]
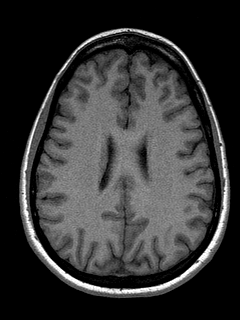
[im 108/144]
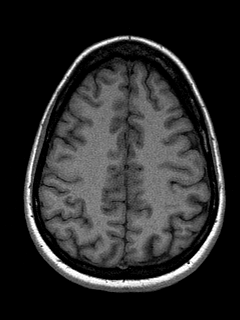
[im 120/144]
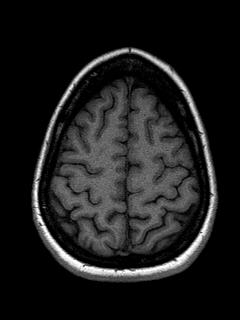
[im 132/144]
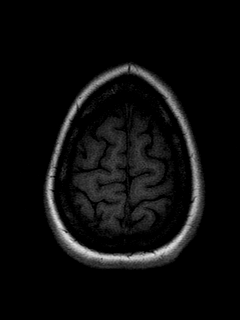
[im 144/144]
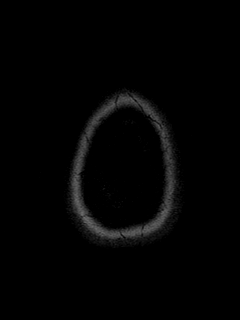

[Series 12: T2 · coronal · 5.0mm · 0.45mm/px · 2 of 25 slices shown (2 of 2)]
[im 1/25]
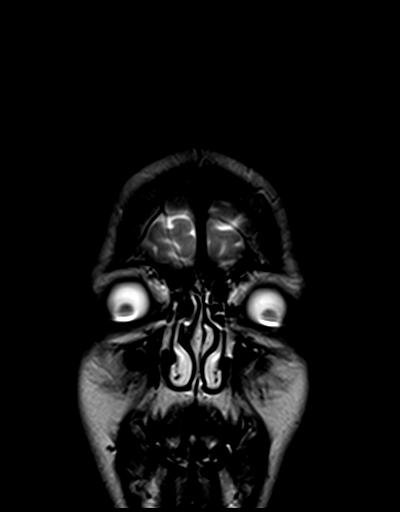
[im 25/25]
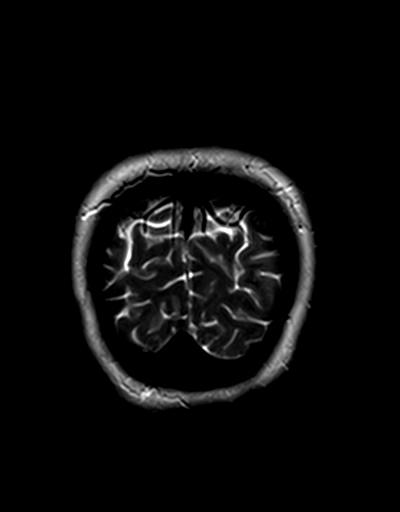

[48 of 48 positions shown; findings below may reference images not displayed]

FINDINGS: Brain:

Cerebral volume is normal.

There are a few scattered punctate foci of T2/FLAIR hyperintensity
within the bilateral cerebral white matter.

There is no acute infarct.

No evidence of intracranial mass.

No chronic intracranial blood products.

No extra-axial fluid collection.

No midline shift.

Vascular: Expected proximal arterial flow voids.

Skull and upper cervical spine: No focal marrow lesion.

Sinuses/Orbits: Visualized orbits show no acute finding. Trace
bilateral ethmoid and maxillary sinus mucosal thickening.
Additionally, small mucous retention cysts are present within the
left maxillary sinus.
IMPRESSION: No evidence of acute intracranial abnormality.

There are a few scattered punctate foci of T2/FLAIR hyperintensity
within the bilateral cerebral white matter, nonspecific, but likely
reflecting minimal chronic small vessel ischemia.

Trace bilateral ethmoid and maxillary sinus mucosal thickening.
Superimposed small left maxillary sinus mucous retention cysts.

## 2022-12-01 DIAGNOSIS — D6861 Antiphospholipid syndrome: Secondary | ICD-10-CM | POA: Diagnosis not present

## 2022-12-01 DIAGNOSIS — L299 Pruritus, unspecified: Secondary | ICD-10-CM | POA: Diagnosis not present

## 2022-12-01 DIAGNOSIS — K5900 Constipation, unspecified: Secondary | ICD-10-CM | POA: Diagnosis not present

## 2022-12-01 DIAGNOSIS — E063 Autoimmune thyroiditis: Secondary | ICD-10-CM | POA: Diagnosis not present

## 2022-12-01 DIAGNOSIS — M2550 Pain in unspecified joint: Secondary | ICD-10-CM | POA: Diagnosis not present

## 2022-12-01 DIAGNOSIS — E039 Hypothyroidism, unspecified: Secondary | ICD-10-CM | POA: Diagnosis not present

## 2022-12-01 DIAGNOSIS — A493 Mycoplasma infection, unspecified site: Secondary | ICD-10-CM | POA: Diagnosis not present

## 2022-12-13 DIAGNOSIS — N926 Irregular menstruation, unspecified: Secondary | ICD-10-CM | POA: Diagnosis not present

## 2022-12-13 DIAGNOSIS — N959 Unspecified menopausal and perimenopausal disorder: Secondary | ICD-10-CM | POA: Diagnosis not present

## 2022-12-13 DIAGNOSIS — Z77120 Contact with and (suspected) exposure to mold (toxic): Secondary | ICD-10-CM | POA: Diagnosis not present

## 2022-12-13 DIAGNOSIS — R5383 Other fatigue: Secondary | ICD-10-CM | POA: Diagnosis not present

## 2023-04-18 IMAGING — CT CT CHEST W/O CM
2 of 4 series · 15 of 36 positions shown, 18 images · non-contrast
Comparison: CT AP 06/10/2019

CLINICAL DATA: Follow-up lung nodule.

EXAM:
CT CHEST WITHOUT CONTRAST
TECHNIQUE: Multidetector CT imaging of the chest was performed following the
standard protocol without IV contrast.

[Series 2: thorax · axial · 0.71mm/px · z∈[+796,+1050]mm · 12 of 151 slices shown, 15 images]
[im 12/151  mediastinal]
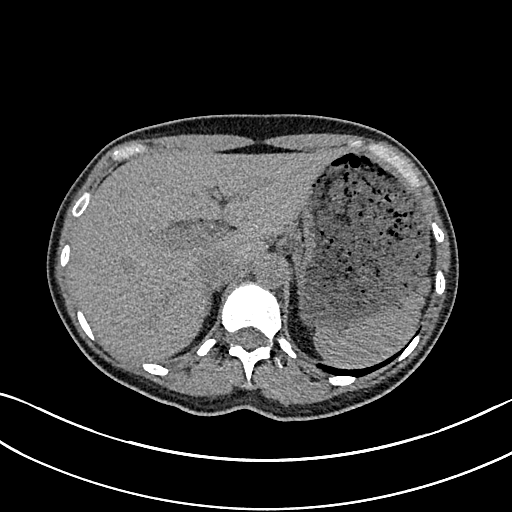
[im 12/151  lung]
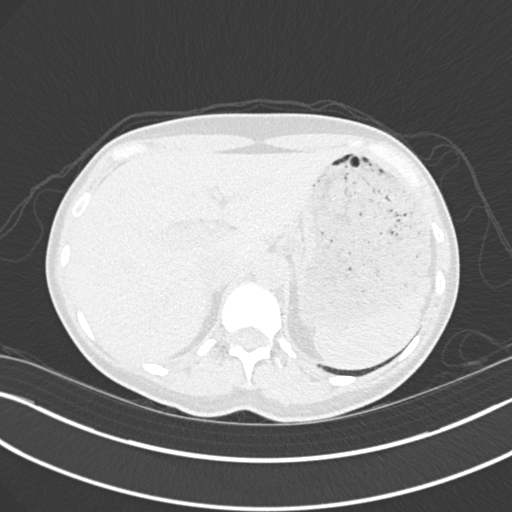
[im 24/151  lung]
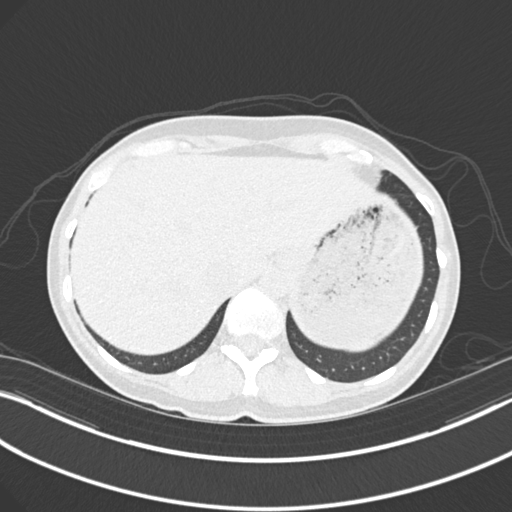
[im 35/151  lung]
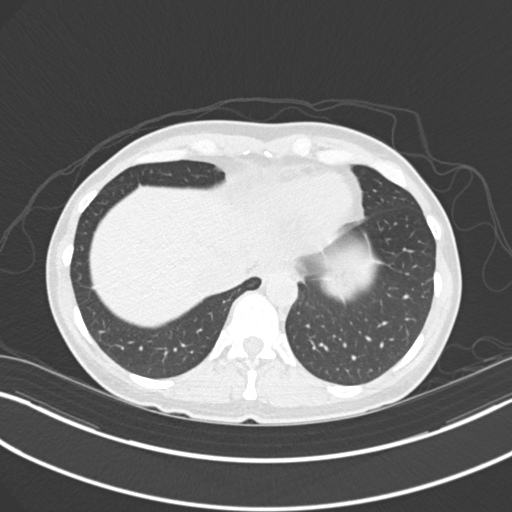
[im 47/151  lung]
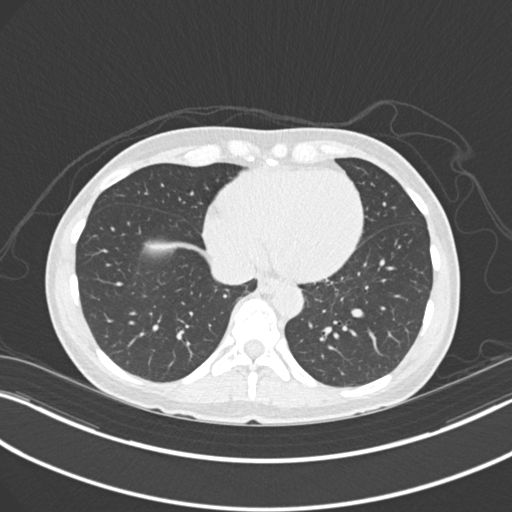
[im 58/151  mediastinal]
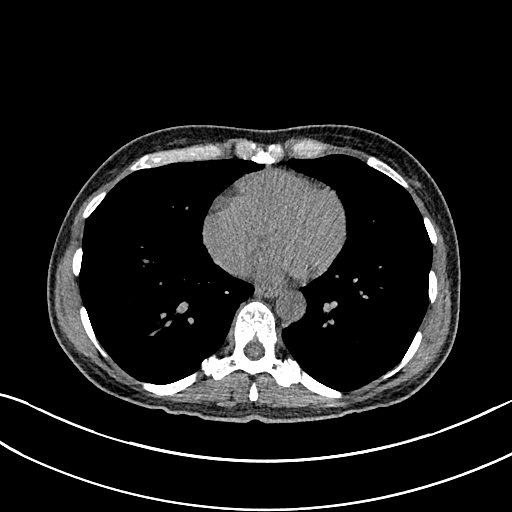
[im 58/151  lung]
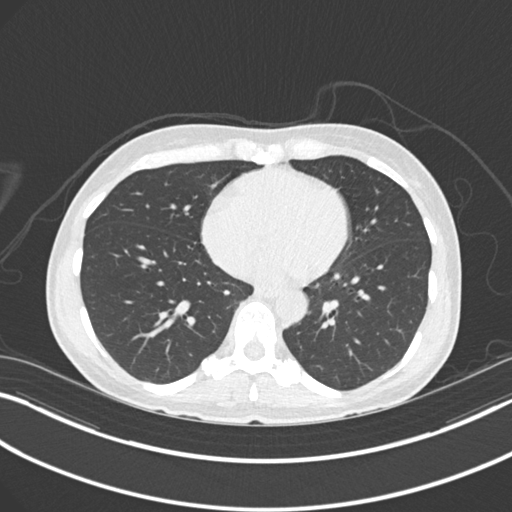
[im 70/151  lung]
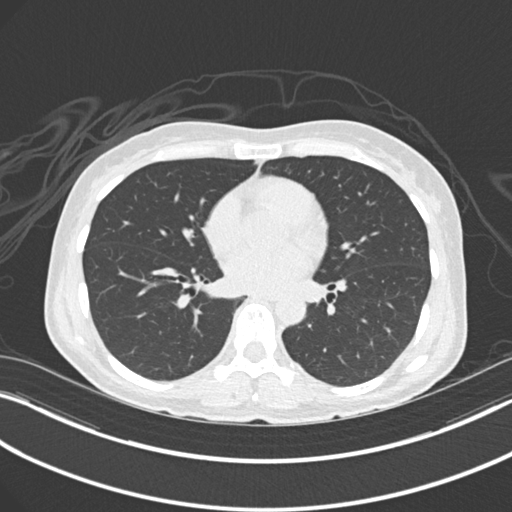
[im 81/151  lung]
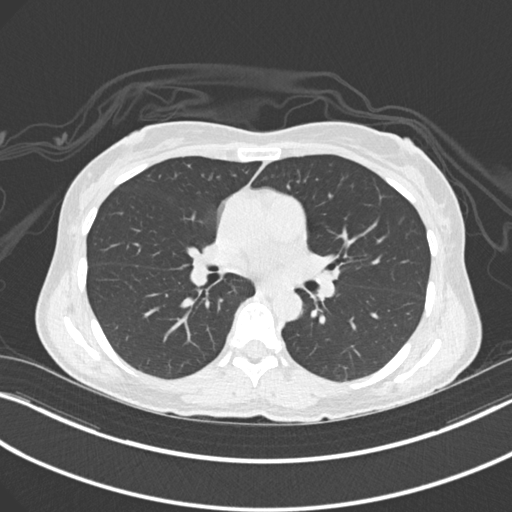
[im 93/151  lung]
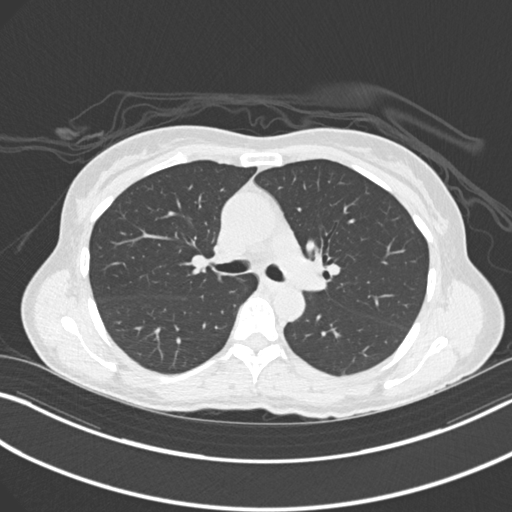
[im 104/151  mediastinal]
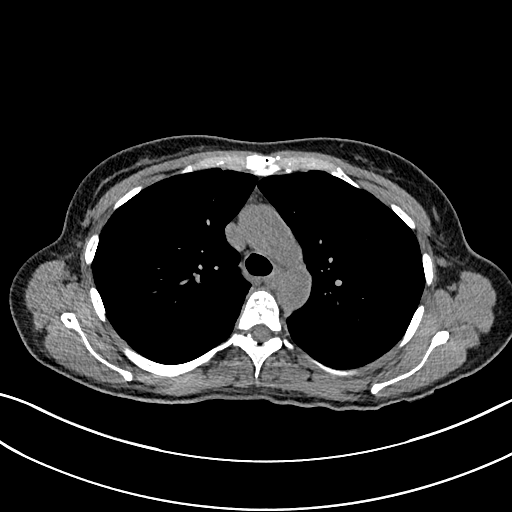
[im 104/151  lung]
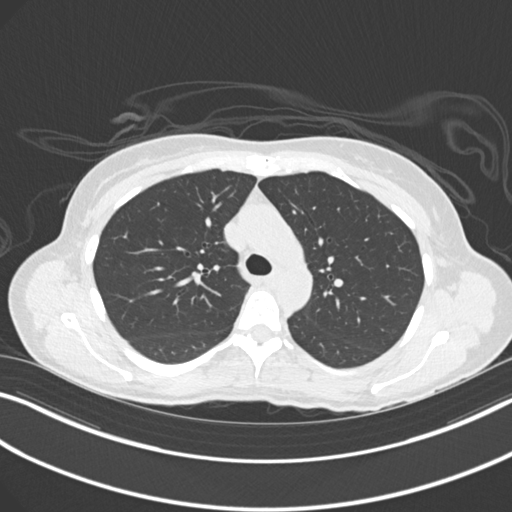
[im 116/151  lung]
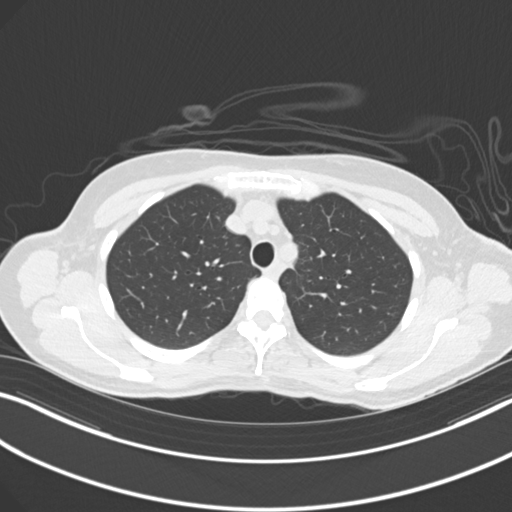
[im 127/151  lung]
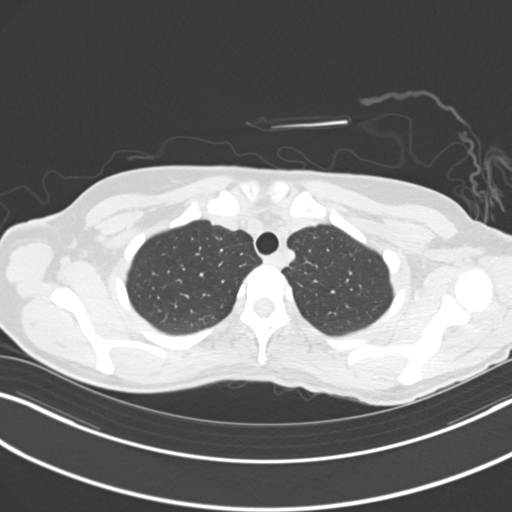
[im 139/151  lung]
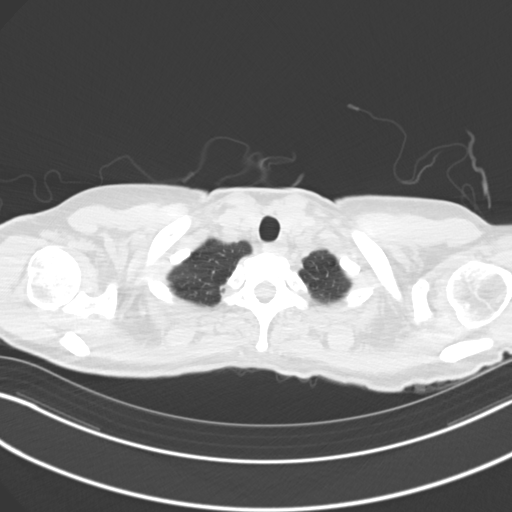

[Series 5: coronal · coronal · 0.59mm/px · 3 of 137 slices shown]
[im 28/137  lung]
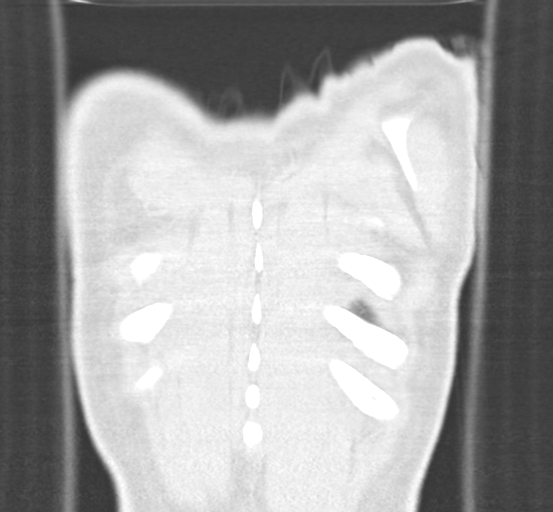
[im 55/137  lung]
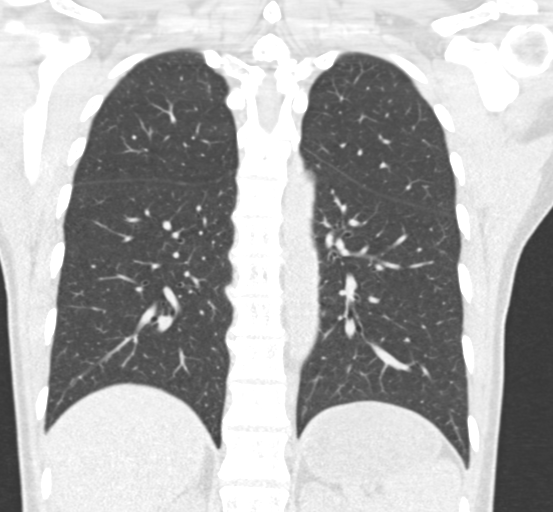
[im 82/137  lung]
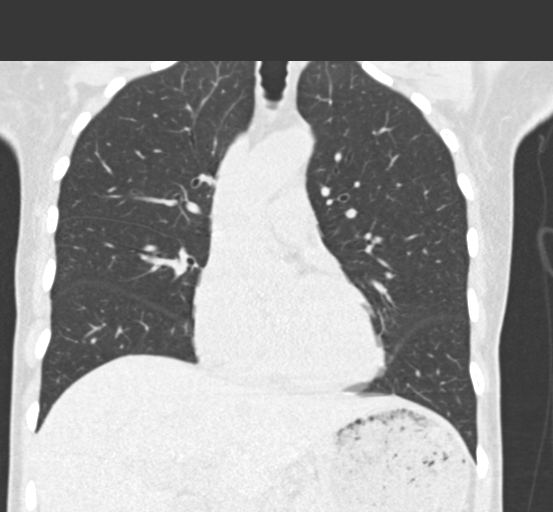

[15 of 36 positions shown; findings below may reference images not displayed]

FINDINGS: Cardiovascular: Heart size is within normal limits. No significant
vascular abnormality identified.

Mediastinum/Nodes: No enlarged mediastinal or axillary lymph nodes.
Thyroid gland, trachea, and esophagus demonstrate no significant
findings.

Lungs/Pleura: No pleural effusion, airspace consolidation, or
atelectasis. Stable 4 mm left lower lobe lung nodule compatible with
a benign abnormality, image 103/3. No suspicious lung nodules
identified at this time.

Upper Abdomen: No acute abnormality.

Musculoskeletal: No chest wall mass or suspicious bone lesions
identified.
IMPRESSION: Stable 4 mm left lower lobe lung nodule compatible with a benign
abnormality. No suspicious lung nodules identified at this time.

## 2023-04-26 DIAGNOSIS — D2271 Melanocytic nevi of right lower limb, including hip: Secondary | ICD-10-CM | POA: Diagnosis not present

## 2023-04-26 DIAGNOSIS — L570 Actinic keratosis: Secondary | ICD-10-CM | POA: Diagnosis not present

## 2023-04-26 DIAGNOSIS — L578 Other skin changes due to chronic exposure to nonionizing radiation: Secondary | ICD-10-CM | POA: Diagnosis not present

## 2023-04-26 DIAGNOSIS — D225 Melanocytic nevi of trunk: Secondary | ICD-10-CM | POA: Diagnosis not present

## 2023-05-05 ENCOUNTER — Other Ambulatory Visit: Payer: Self-pay | Admitting: Family Medicine

## 2023-05-05 DIAGNOSIS — Z1231 Encounter for screening mammogram for malignant neoplasm of breast: Secondary | ICD-10-CM

## 2023-05-31 ENCOUNTER — Ambulatory Visit
Admission: RE | Admit: 2023-05-31 | Discharge: 2023-05-31 | Disposition: A | Discharge status: Home / Self Care | Payer: BC Managed Care – PPO | Source: Ambulatory Visit | Attending: Family Medicine | Admitting: Family Medicine

## 2023-05-31 DIAGNOSIS — Z1231 Encounter for screening mammogram for malignant neoplasm of breast: Secondary | ICD-10-CM

## 2023-06-12 DIAGNOSIS — R1310 Dysphagia, unspecified: Secondary | ICD-10-CM | POA: Diagnosis not present

## 2023-06-12 DIAGNOSIS — K449 Diaphragmatic hernia without obstruction or gangrene: Secondary | ICD-10-CM | POA: Diagnosis not present

## 2023-06-12 DIAGNOSIS — K2950 Unspecified chronic gastritis without bleeding: Secondary | ICD-10-CM | POA: Diagnosis not present

## 2023-06-12 DIAGNOSIS — R1013 Epigastric pain: Secondary | ICD-10-CM | POA: Diagnosis not present

## 2023-06-12 DIAGNOSIS — K2970 Gastritis, unspecified, without bleeding: Secondary | ICD-10-CM | POA: Diagnosis not present

## 2023-06-12 DIAGNOSIS — K222 Esophageal obstruction: Secondary | ICD-10-CM | POA: Diagnosis not present

## 2023-10-19 ENCOUNTER — Other Ambulatory Visit: Payer: Self-pay | Admitting: Family Medicine

## 2023-10-19 DIAGNOSIS — R519 Headache, unspecified: Secondary | ICD-10-CM

## 2023-12-05 ENCOUNTER — Ambulatory Visit (HOSPITAL_BASED_OUTPATIENT_CLINIC_OR_DEPARTMENT_OTHER)
Admission: RE | Admit: 2023-12-05 | Discharge: 2023-12-05 | Disposition: A | Discharge status: Home / Self Care | Payer: BC Managed Care – PPO | Source: Ambulatory Visit | Attending: Family Medicine | Admitting: Family Medicine

## 2023-12-05 ENCOUNTER — Encounter: Payer: Self-pay | Admitting: Family Medicine

## 2023-12-05 ENCOUNTER — Ambulatory Visit (INDEPENDENT_AMBULATORY_CARE_PROVIDER_SITE_OTHER): Payer: BC Managed Care – PPO | Admitting: Family Medicine

## 2023-12-05 VITALS — BP 140/100 | HR 67 | Temp 98.4°F | Resp 18 | Ht 65.5 in | Wt 165.6 lb

## 2023-12-05 DIAGNOSIS — R102 Pelvic and perineal pain: Secondary | ICD-10-CM | POA: Diagnosis not present

## 2023-12-05 DIAGNOSIS — R1011 Right upper quadrant pain: Secondary | ICD-10-CM | POA: Diagnosis not present

## 2023-12-05 DIAGNOSIS — N951 Menopausal and female climacteric states: Secondary | ICD-10-CM | POA: Diagnosis not present

## 2023-12-05 LAB — POC URINALSYSI DIPSTICK (AUTOMATED)
Bilirubin, UA: NEGATIVE
Blood, UA: NEGATIVE
Glucose, UA: NEGATIVE
Nitrite, UA: NEGATIVE
Protein, UA: NEGATIVE
Spec Grav, UA: 1.015 (ref 1.010–1.025)
Urobilinogen, UA: 0.2 U/dL
pH, UA: 6 (ref 5.0–8.0)

## 2023-12-05 NOTE — Progress Notes (Signed)
Established Patient Office Visit  Subjective   Patient ID: Gina Pineda, female    DOB: 1971/06/11  Age: 53 y.o. MRN: [id number]  Chief Complaint  Patient presents with   Hot Flashes   Abdominal Pain    Pt states having pain lower left pain.     HPI Pt is here to discuss her perimenopausal symptoms---- severe hot flashes.  They keep her awake every night.   She had her IUD removed === she does not want to go on bcp due to fam hx breast cancer  Patient Active Problem List   Diagnosis Date Noted   Perimenopause 12/08/2023   Right upper quadrant abdominal pain 12/08/2023   Neck pain 01/22/2021   Myalgia 12/28/2020   Pain in joint, multiple sites 12/28/2020   History of antiphospholipid antibody syndrome 12/28/2020   COVID-19 12/09/2020   HTN (hypertension) 04/15/2016   Suprapubic pain 08/18/2015   Palpitations 03/26/2014   HYPERGLYCEMIA 12/07/2009   Backache 12/29/2008   FATIGUE 12/29/2008   Hypothyroidism 03/01/2007   Migraine without aura 03/01/2007   DEVIATED NASAL SEPTUM 03/01/2007   Past Medical History:  Diagnosis Date   Anxiety    Ovarian cyst 08/2015   Thyroid disease    Hypothyroidism   Past Surgical History:  Procedure Laterality Date   BREAST BIOPSY Left    CESAREAN SECTION     INTRAUTERINE DEVICE INSERTION     NASAL SEPTUM SURGERY     Social History   Tobacco Use   Smoking status: Never   Smokeless tobacco: Never  Vaping Use   Vaping status: Never Used  Substance Use Topics   Alcohol use: Yes    Comment: 4-5 drinks/ month   Drug use: No   Social History   Socioeconomic History   Marital status: Married    Spouse name: Not on file   Number of children: 2   Years of education: Not on file   Highest education level: Not on file  Occupational History   Occupation: massage therapist    Employer: [employer]  Tobacco Use   Smoking status: Never   Smokeless tobacco: Never  Vaping Use   Vaping status: Never Used  Substance and  Sexual Activity   Alcohol use: Yes    Comment: 4-5 drinks/ month   Drug use: No   Sexual activity: Yes    Partners: Male  Other Topics Concern   Not on file  Social History Narrative   GETS REG EXERCISE         Social Drivers of Corporate investment banker Strain: Not on file  Food Insecurity: Not on file  Transportation Needs: Not on file  Physical Activity: Not on file  Stress: Not on file  Social Connections: Unknown (03/29/2022)   Received from [facility], [facility]   Social Network    Social Network: Not on file  Intimate Partner Violence: Unknown (02/17/2022)   Received from [employer], [facility]   HITS    Physically Hurt: Not on file    Insult or Talk Down To: Not on file    Threaten Physical Harm: Not on file    Scream or Curse: Not on file   Family Status  Relation Name Status   Mother  Alive   Father  Alive   Sister  Alive   MGM  (Not Specified)   MGF  Deceased   Mat Aunt  Deceased   Other  (Not Specified)  No partnership data on  file   Family History  Problem Relation Age of Onset   Hypertension Father    Hyperlipidemia Father    Breast cancer Maternal Grandmother    Cancer Maternal Grandmother 40       breast   Diabetes Maternal Grandfather    Cancer Maternal Aunt 29       breast   Breast cancer Maternal Aunt    Breast cancer Other    Allergies  Allergen Reactions   Potassium-Containing Compounds Swelling      Review of Systems  Constitutional:  Negative for fever and malaise/fatigue.       Hot flashes and abd pain  HENT:  Negative for congestion.   Eyes:  Negative for blurred vision.  Respiratory:  Negative for shortness of breath.   Cardiovascular:  Negative for chest pain, palpitations and leg swelling.  Gastrointestinal:  Positive for abdominal pain. Negative for blood in stool and nausea.  Genitourinary:  Negative for dysuria and frequency.  Musculoskeletal:  Negative for falls.  Skin:  Negative for rash.   Neurological:  Negative for dizziness, loss of consciousness and headaches.  Endo/Heme/Allergies:  Negative for environmental allergies.  Psychiatric/Behavioral:  Negative for depression. The patient is not nervous/anxious.       Objective:     BP (!) 140/100 (BP Location: Left Arm, Patient Position: Sitting, Cuff Size: Normal)   Pulse 67   Temp 98.4 F (36.9 C) (Oral)   Resp 18   Ht 5' 5.5" (1.664 m)   Wt 165 lb 9.6 oz (75.1 kg)   SpO2 99%   BMI 27.14 kg/m  BP Readings from Last 3 Encounters:  12/06/23 (!) 139/95  12/05/23 (!) 140/100  09/23/21 (!) 154/96   Wt Readings from Last 3 Encounters:  12/06/23 165 lb 4 oz (75 kg)  12/05/23 165 lb 9.6 oz (75.1 kg)  09/23/21 147 lb (66.7 kg)   SpO2 Readings from Last 3 Encounters:  12/05/23 99%  09/23/21 100%  01/22/21 99%      Physical Exam   Results for orders placed or performed in visit on 12/05/23  Urine Culture   Specimen: Urine  Result Value Ref Range   MICRO NUMBER: [id number]    SPECIMEN QUALITY: Adequate    Sample Source URINE    STATUS: FINAL    Result: No Growth   CBC with Differential/Platelet  Result Value Ref Range   WBC 6.3 3.8 - 10.8 Thousand/uL   RBC 4.71 3.80 - 5.10 Million/uL   Hemoglobin 15.2 11.7 - 15.5 g/dL   HCT 54.0 98.1 - 19.1 %   MCV 92.8 80.0 - 100.0 fL   MCH 32.3 27.0 - 33.0 pg   MCHC 34.8 32.0 - 36.0 g/dL   RDW 47.8 29.5 - 62.1 %   Platelets 259 140 - 400 Thousand/uL   MPV 10.8 7.5 - 12.5 fL   Neutro Abs 4,297 1,500 - 7,800 cells/uL   Absolute Lymphocytes 1,443 850 - 3,900 cells/uL   Absolute Monocytes 491 200 - 950 cells/uL   Eosinophils Absolute 38 15 - 500 cells/uL   Basophils Absolute 32 0 - 200 cells/uL   Neutrophils Relative % 68.2 %   Total Lymphocyte 22.9 %   Monocytes Relative 7.8 %   Eosinophils Relative 0.6 %   Basophils Relative 0.5 %  Comprehensive metabolic panel  Result Value Ref Range   Glucose, Bld 92 65 - 99 mg/dL   BUN 12 7 - 25 mg/dL   Creat 3.08 6.57 -  8.46 mg/dL  BUN/Creatinine Ratio SEE NOTE: 6 - 22 (calc)   Sodium 138 135 - 146 mmol/L   Potassium 4.2 3.5 - 5.3 mmol/L   Chloride 99 98 - 110 mmol/L   CO2 25 20 - 32 mmol/L   Calcium 9.9 8.6 - 10.4 mg/dL   Total Protein 7.6 6.1 - 8.1 g/dL   Albumin 4.5 3.6 - 5.1 g/dL   Globulin 3.1 1.9 - 3.7 g/dL (calc)   AG Ratio 1.5 1.0 - 2.5 (calc)   Total Bilirubin 0.7 0.2 - 1.2 mg/dL   Alkaline phosphatase (APISO) 68 37 - 153 U/L   AST 21 10 - 35 U/L   ALT 23 6 - 29 U/L  TSH  Result Value Ref Range   TSH 2.49 mIU/L  POCT Urinalysis Dipstick (Automated)  Result Value Ref Range   Color, UA yellow    Clarity, UA cloudy    Glucose, UA Negative Negative   Bilirubin, UA neg    Ketones, UA 80mg     Spec Grav, UA 1.015 1.010 - 1.025   Blood, UA neg    pH, UA 6.0 5.0 - 8.0   Protein, UA Negative Negative   Urobilinogen, UA 0.2 0.2 or 1.0 E.U./dL   Nitrite, UA neg    Leukocytes, UA Trace (A) Negative    Last CBC Lab Results  Component Value Date   WBC 6.3 12/05/2023   HGB 15.2 12/05/2023   HCT 43.7 12/05/2023   MCV 92.8 12/05/2023   MCH 32.3 12/05/2023   RDW 12.3 12/05/2023   PLT 259 12/05/2023   Last metabolic panel Lab Results  Component Value Date   GLUCOSE 92 12/05/2023   NA 138 12/05/2023   K 4.2 12/05/2023   CL 99 12/05/2023   CO2 25 12/05/2023   BUN 12 12/05/2023   CREATININE 0.66 12/05/2023   GFRNONAA >60 09/23/2021   CALCIUM 9.9 12/05/2023   PROT 7.6 12/05/2023   ALBUMIN 4.7 09/23/2021   BILITOT 0.7 12/05/2023   ALKPHOS 53 09/23/2021   AST 21 12/05/2023   ALT 23 12/05/2023   ANIONGAP 8 09/23/2021   Last lipids Lab Results  Component Value Date   CHOL 144 10/02/2018   HDL 50.80 10/02/2018   LDLCALC 85 10/02/2018   TRIG 40.0 10/02/2018   CHOLHDL 3 10/02/2018   Last hemoglobin A1c Lab Results  Component Value Date   HGBA1C 5.9 12/06/2023   Last thyroid functions Lab Results  Component Value Date   TSH 2.49 12/05/2023   T4TOTAL 5.0 (L) 10/02/2018    Last vitamin D No results found for: "25OHVITD2", "25OHVITD3", "VD25OH" Last vitamin B12 and Folate Lab Results  Component Value Date   VITAMINB12 >1537 (H) 12/06/2023   FOLATE 12.8 12/29/2008      The ASCVD Risk score (Arnett DK, et al., 2019) failed to calculate for the following reasons:   Cannot find a previous HDL lab   Cannot find a previous total cholesterol lab    Assessment & Plan:   Problem List Items Addressed This Visit       Unprioritized   Suprapubic pain   Relevant Orders   POCT Urinalysis Dipstick (Automated) (Completed)   US Abdomen Complete (Completed)   Urine Culture (Completed)   CBC with Differential/Platelet (Completed)   Comprehensive metabolic panel (Completed)   Right upper quadrant abdominal pain   Check labs  Check USi If pain worsens , go to ER      Relevant Orders   US Abdomen Complete (Completed)   CBC  with Differential/Platelet (Completed)   Comprehensive metabolic panel (Completed)   Perimenopause - Primary   +hot flashes ---  f/u gyn      Relevant Orders   Ambulatory referral to Obstetrics / Gynecology   TSH (Completed)                      No follow-ups on file.    Donato Schultz, DO

## 2023-12-06 ENCOUNTER — Ambulatory Visit: Payer: Self-pay | Admitting: Family Medicine

## 2023-12-06 ENCOUNTER — Encounter: Payer: Self-pay | Admitting: Physician Assistant

## 2023-12-06 ENCOUNTER — Other Ambulatory Visit: Payer: Self-pay | Admitting: Physician Assistant

## 2023-12-06 ENCOUNTER — Encounter: Payer: Self-pay | Admitting: Family Medicine

## 2023-12-06 ENCOUNTER — Ambulatory Visit (INDEPENDENT_AMBULATORY_CARE_PROVIDER_SITE_OTHER): Payer: BC Managed Care – PPO | Admitting: Physician Assistant

## 2023-12-06 VITALS — BP 139/95 | HR 75 | Temp 97.7°F | Resp 97 | Ht 65.5 in | Wt 165.2 lb

## 2023-12-06 DIAGNOSIS — R232 Flushing: Secondary | ICD-10-CM | POA: Diagnosis not present

## 2023-12-06 DIAGNOSIS — R7303 Prediabetes: Secondary | ICD-10-CM | POA: Diagnosis not present

## 2023-12-06 LAB — COMPREHENSIVE METABOLIC PANEL
AG Ratio: 1.5 (calc) (ref 1.0–2.5)
ALT: 23 U/L (ref 6–29)
AST: 21 U/L (ref 10–35)
Albumin: 4.5 g/dL (ref 3.6–5.1)
Alkaline phosphatase (APISO): 68 U/L (ref 37–153)
BUN: 12 mg/dL (ref 7–25)
CO2: 25 mmol/L (ref 20–32)
Calcium: 9.9 mg/dL (ref 8.6–10.4)
Chloride: 99 mmol/L (ref 98–110)
Creat: 0.66 mg/dL (ref 0.50–1.03)
Globulin: 3.1 g/dL (ref 1.9–3.7)
Glucose, Bld: 92 mg/dL (ref 65–99)
Potassium: 4.2 mmol/L (ref 3.5–5.3)
Sodium: 138 mmol/L (ref 135–146)
Total Bilirubin: 0.7 mg/dL (ref 0.2–1.2)
Total Protein: 7.6 g/dL (ref 6.1–8.1)

## 2023-12-06 LAB — CBC WITH DIFFERENTIAL/PLATELET
Absolute Lymphocytes: 1443 {cells}/uL (ref 850–3900)
Absolute Monocytes: 491 {cells}/uL (ref 200–950)
Basophils Absolute: 32 {cells}/uL (ref 0–200)
Basophils Relative: 0.5 %
Eosinophils Absolute: 38 {cells}/uL (ref 15–500)
Eosinophils Relative: 0.6 %
HCT: 43.7 % (ref 35.0–45.0)
Hemoglobin: 15.2 g/dL (ref 11.7–15.5)
MCH: 32.3 pg (ref 27.0–33.0)
MCHC: 34.8 g/dL (ref 32.0–36.0)
MCV: 92.8 fL (ref 80.0–100.0)
MPV: 10.8 fL (ref 7.5–12.5)
Monocytes Relative: 7.8 %
Neutro Abs: 4297 {cells}/uL (ref 1500–7800)
Neutrophils Relative %: 68.2 %
Platelets: 259 10*3/uL (ref 140–400)
RBC: 4.71 10*6/uL (ref 3.80–5.10)
RDW: 12.3 % (ref 11.0–15.0)
Total Lymphocyte: 22.9 %
WBC: 6.3 10*3/uL (ref 3.8–10.8)

## 2023-12-06 LAB — TSH: TSH: 2.49 m[IU]/L

## 2023-12-06 LAB — URINE CULTURE
MICRO NUMBER:: 15981786
Result:: NO GROWTH
SPECIMEN QUALITY:: ADEQUATE

## 2023-12-06 LAB — VITAMIN B12: Vitamin B-12: >1537 pg/mL — ABNORMAL HIGH (ref 211–911)

## 2023-12-06 LAB — HEMOGLOBIN A1C: Hgb A1c MFr Bld: 5.9 % (ref 4.6–6.5)

## 2023-12-06 MED ORDER — VENLAFAXINE HCL ER 37.5 MG PO CP24: 37.5000 mg | ORAL_CAPSULE | Freq: Every day | ORAL | 0 refills | Status: DC

## 2023-12-06 MED ORDER — BLOOD GLUCOSE MONITORING SUPPL DEVI: 1.0000 | Freq: Three times a day (TID) | 0 refills | Status: AC

## 2023-12-06 MED ORDER — BLOOD GLUCOSE TEST VI STRP: 1.0000 | ORAL_STRIP | Freq: Three times a day (TID) | 0 refills | Status: AC

## 2023-12-06 MED ORDER — LANCET DEVICE MISC: 1.0000 | Freq: Three times a day (TID) | 0 refills | Status: AC

## 2023-12-06 MED ORDER — VENLAFAXINE HCL ER 75 MG PO CP24: 75.0000 mg | ORAL_CAPSULE | Freq: Every day | ORAL | 1 refills | Status: DC

## 2023-12-06 MED ORDER — LANCETS MISC. MISC: 1.0000 | Freq: Three times a day (TID) | 0 refills | Status: AC

## 2023-12-06 NOTE — Progress Notes (Signed)
Established patient visit   Patient: Gina Pineda   DOB: Apr 24, 1971   53 y.o. Female  MRN: [id number] Visit Date: 12/06/2023  Today's healthcare provider: Alfredia Ferguson, PA-C   Cc.  Night sweats, blood sugar concerns  Subjective    Pt was seen yesterday in office, thought she was having a diverticulitis flare, concerned about night sweats.   She has multiple symptoms that she is concerned about causing significant distress.  She reports she has been experiencing night sweats and almost adrenaline rushes that happened in the middle of the night where she wakes up feeling like she is in fight or flight with a shakiness internally.  She reports heightened anxiety, she is also checking her blood pressure at home and noticed higher than normal values.  This is also bringing her anxiety.  She has a family history of diabetes and has been prediabetic herself and is concerned that these feelings are low blood sugar level.  She has missed 3 menstrual cycles so far.  She had an episode last night/this morning she checked her blood pressure which is 170s over 100s which concerned her.    Medications: Outpatient Medications Prior to Visit  Medication Sig   ARMOUR THYROID 15 MG tablet TAKE ONE BY MOUTH ONCE DAILY WITH 60 MG (75 MG TOTAL), EMPTY STOMACH   ARMOUR THYROID PO Take 75 mg by mouth.   cholecalciferol (VITAMIN D3) 25 MCG (1000 UT) tablet Take 1,000 Units by mouth daily.   SUMAtriptan (IMITREX) 50 MG tablet Take 1 tablet (50 mg total) by mouth every 2 (two) hours as needed for migraine. Patient needs an office visit for further refills   [DISCONTINUED] NALTREXONE HCL PO Take 3 mg by mouth daily.   [DISCONTINUED] vitamin k 100 MCG tablet Take 100 mcg by mouth daily. (Patient not taking: Reported on 09/23/2021)   No facility-administered medications prior to visit.    Review of Systems  Constitutional:  Positive for diaphoresis. Negative for fatigue and fever.  Respiratory:   Negative for cough and shortness of breath.   Cardiovascular:  Negative for chest pain and leg swelling.  Gastrointestinal:  Negative for abdominal pain.  Neurological:  Negative for dizziness and headaches.  Psychiatric/Behavioral:  Positive for agitation. The patient is nervous/anxious.        Objective    BP (!) 139/95   Pulse 75   Temp 97.7 F (36.5 C) (Oral)   Resp (!) 97   Ht 5' 5.5" (1.664 m)   Wt 165 lb 4 oz (75 kg)   BMI 27.08 kg/m    Physical Exam Vitals reviewed.  Constitutional:      Appearance: She is not ill-appearing.  HENT:     Head: Normocephalic.  Eyes:     Conjunctiva/sclera: Conjunctivae normal.  Cardiovascular:     Rate and Rhythm: Normal rate.  Pulmonary:     Effort: Pulmonary effort is normal. No respiratory distress.  Neurological:     General: No focal deficit present.     Mental Status: She is alert and oriented to person, place, and time.  Psychiatric:        Mood and Affect: Mood normal.        Behavior: Behavior normal.     No results found for any visits on 12/06/23.  Assessment & Plan    Vasomotor flushing -     Venlafaxine HCl ER; Take 1 capsule (37.5 mg total) by mouth daily with breakfast. For one week, and  then take two tablets daily  Dispense: 60 capsule; Refill: 0 -     Vitamin B12  Prediabetes -     Hemoglobin A1c -     Blood Glucose Monitoring Suppl; 1 each by Does not apply route in the morning, at noon, and at bedtime. May substitute to any manufacturer covered by patient's insurance.  Dispense: 1 each; Refill: 0 -     Blood Glucose Test; 1 each by In Vitro route in the morning, at noon, and at bedtime. May substitute to any manufacturer covered by patient's insurance.  Dispense: 100 strip; Refill: 0 -     Lancet Device; 1 each by Does not apply route in the morning, at noon, and at bedtime. May substitute to any manufacturer covered by patient's insurance.  Dispense: 1 each; Refill: 0 -     Lancets Misc.; 1 each by Does  not apply route in the morning, at noon, and at bedtime. May substitute to any manufacturer covered by patient's insurance.  Dispense: 100 each; Refill: 0   Explained to patient there is a low likelihood that she is having hypoglycemic episodes given her normal random blood sugars and A1c 1/24 was 5.7%.  Per patient preference we will repeat an A1c today.  Advised the only way to know if these episodes are hypoglycemic episodes is to check her blood sugar when she is having symptoms.  I did send in a glucometer her to her pharmacy.  I am more inclined that these are vasomotor symptoms secondary to perimenopause along with her heightened anxiety and elevated blood pressure.  She has a referral in to a gynecologist, but I did review some medication options for vasomotor flushing and anxiety, i.e. Effexor.  Patient feels ready to start this therapy.  Advised starting Effexor XR 37.5 mg once daily for a week and then increase to 75 mg daily.  Advised patient schedule follow-up in the next 4 to 6 weeks with her PCP.  Return in about 6 weeks (around 01/17/2024) for w/ pcp.       Alfredia Ferguson, PA-C  [facility] [phone] (phone) [phone] (fax)  [facility]

## 2023-12-06 NOTE — Telephone Encounter (Signed)
  Pharmacy comment: Alternative Requested:INSURANCE ONLY PAYS FOR ONCE DAILY DOSING

## 2023-12-06 NOTE — Telephone Encounter (Signed)
  Chief Complaint: hypertension Symptoms: shaking, night sweats, headache, anxiety Frequency: one month, worsening today Pertinent Negatives: Patient denies CP Disposition: [] ED /[] Urgent Care (no appt availability in office) / [x] Appointment(In office/virtual)/ []  [facility]/ [] Home Care/ [] Refused Recommended Disposition /[] [facility]/ []  Follow-up with PCP Additional Notes: Patient calls with multiple complaints- anxious, high blood pressure, night sweats, headache that has been ongoing for approx 30 days. States she was seen in office yesterday and she has concerns over her glucose report. States she was not fasting for those labs and it was still lower or within range, and she is concerned that her symptoms at night may be coming from low blood sugar. Patient reports prior to calling triage BP was 173/95, During call she had two readings 195/117 (patient was tearful and talking during this reading), followed by 171/103 after resting on call. Per protocol, patient to be evaluated within 24 hours. Due to intermittent higher readings, this RN scheduled patient for today for eval with first available provider. Care advice reviewed, patient verbalized understanding. Reports her husband will drive her to the appt due to her symptoms. Alerting PCP for review.   Copied from CRM [id number]. Topic: Clinical - Red Word Triage >> Dec 06, 2023  8:01 AM Donita Brooks wrote: Red Word that prompted transfer to Nurse Triage:  night sweats and head hurts, blood pressure high. not feeling well Reason for Disposition  Systolic BP  >= 180 OR Diastolic >= 110  Answer Assessment - Initial Assessment Questions 1. BLOOD PRESSURE: "What is the blood pressure?" "Did you take at least two measurements 5 minutes apart?"     173/95 prior to calling triage, During triage 171/103 2. ONSET: "When did you take your blood pressure?"     Ten minutes prior to calling triage, second during call. 3. HOW: "How  did you take your blood pressure?" (e.g., automatic home BP monitor, visiting nurse)     Home BP monitor 4. HISTORY: "Do you have a history of high blood pressure?"     States she has been told in the past it has been elevated. 5. MEDICINES: "Are you taking any medicines for blood pressure?" "Have you missed any doses recently?"     Denies 6. OTHER SYMPTOMS: "Do you have any symptoms?" (e.g., blurred vision, chest pain, difficulty breathing, headache, weakness)     SOB, anxious, tearful, headache, vision is blurry when she gets a headache.  Answer Assessment - Initial Assessment Questions 1. REASON FOR CALL or QUESTION: "What is your reason for calling today?" or "How can I best help you?" or "What question do you have that I can help answer?"     *No Answer*  Protocols used: Blood Pressure - High-A-AH, Information Only Call - No Triage-A-AH

## 2023-12-07 ENCOUNTER — Encounter: Payer: Self-pay | Admitting: Family Medicine

## 2023-12-08 DIAGNOSIS — N951 Menopausal and female climacteric states: Secondary | ICD-10-CM | POA: Insufficient documentation

## 2023-12-08 DIAGNOSIS — R1011 Right upper quadrant pain: Secondary | ICD-10-CM | POA: Insufficient documentation

## 2023-12-08 NOTE — Assessment & Plan Note (Signed)
+  hot flashes ---  f/u gyn

## 2023-12-08 NOTE — Assessment & Plan Note (Signed)
Check labs  Check USi If pain worsens , go to ER

## 2023-12-28 ENCOUNTER — Other Ambulatory Visit: Payer: Self-pay | Admitting: Family Medicine

## 2023-12-28 ENCOUNTER — Other Ambulatory Visit: Payer: Self-pay | Admitting: Physician Assistant

## 2023-12-28 DIAGNOSIS — R232 Flushing: Secondary | ICD-10-CM

## 2023-12-28 MED ORDER — VENLAFAXINE HCL ER 75 MG PO CP24: 75.0000 mg | ORAL_CAPSULE | Freq: Every day | ORAL | 0 refills | Status: DC

## 2023-12-28 NOTE — Telephone Encounter (Signed)
Plan doesn't cover venlafaxine 37.5mg  2 capsules daily, okay to change to venlafaxine 75mg ?

## 2024-02-17 ENCOUNTER — Other Ambulatory Visit: Payer: Self-pay | Admitting: Family Medicine

## 2024-02-17 DIAGNOSIS — R519 Headache, unspecified: Secondary | ICD-10-CM

## 2024-03-28 ENCOUNTER — Other Ambulatory Visit: Payer: Self-pay | Admitting: Family Medicine

## 2024-04-02 DIAGNOSIS — D225 Melanocytic nevi of trunk: Secondary | ICD-10-CM | POA: Diagnosis not present

## 2024-04-02 DIAGNOSIS — L821 Other seborrheic keratosis: Secondary | ICD-10-CM | POA: Diagnosis not present

## 2024-04-02 DIAGNOSIS — L578 Other skin changes due to chronic exposure to nonionizing radiation: Secondary | ICD-10-CM | POA: Diagnosis not present

## 2024-04-02 DIAGNOSIS — L570 Actinic keratosis: Secondary | ICD-10-CM | POA: Diagnosis not present

## 2024-05-31 ENCOUNTER — Encounter: Payer: Self-pay | Admitting: Advanced Practice Midwife

## 2024-06-03 DIAGNOSIS — Z6828 Body mass index (BMI) 28.0-28.9, adult: Secondary | ICD-10-CM | POA: Diagnosis not present

## 2024-06-03 DIAGNOSIS — Z01419 Encounter for gynecological examination (general) (routine) without abnormal findings: Secondary | ICD-10-CM | POA: Diagnosis not present

## 2024-06-03 DIAGNOSIS — Z124 Encounter for screening for malignant neoplasm of cervix: Secondary | ICD-10-CM | POA: Diagnosis not present

## 2024-06-03 DIAGNOSIS — Z1151 Encounter for screening for human papillomavirus (HPV): Secondary | ICD-10-CM | POA: Diagnosis not present

## 2024-06-03 DIAGNOSIS — N951 Menopausal and female climacteric states: Secondary | ICD-10-CM | POA: Diagnosis not present

## 2024-06-03 DIAGNOSIS — Z83711 Family history of hyperplastic colon polyps: Secondary | ICD-10-CM | POA: Diagnosis not present

## 2024-06-03 DIAGNOSIS — Z171 Estrogen receptor negative status [ER-]: Secondary | ICD-10-CM | POA: Diagnosis not present

## 2024-06-03 DIAGNOSIS — Z800 Family history of malignant neoplasm of digestive organs: Secondary | ICD-10-CM | POA: Diagnosis not present

## 2024-06-03 DIAGNOSIS — Z803 Family history of malignant neoplasm of breast: Secondary | ICD-10-CM | POA: Diagnosis not present

## 2024-06-03 DIAGNOSIS — F419 Anxiety disorder, unspecified: Secondary | ICD-10-CM | POA: Diagnosis not present

## 2024-06-06 ENCOUNTER — Telehealth (INDEPENDENT_AMBULATORY_CARE_PROVIDER_SITE_OTHER): Admitting: Family Medicine

## 2024-06-06 DIAGNOSIS — N951 Menopausal and female climacteric states: Secondary | ICD-10-CM

## 2024-06-06 DIAGNOSIS — F419 Anxiety disorder, unspecified: Secondary | ICD-10-CM

## 2024-06-06 DIAGNOSIS — E039 Hypothyroidism, unspecified: Secondary | ICD-10-CM

## 2024-06-06 DIAGNOSIS — R232 Flushing: Secondary | ICD-10-CM

## 2024-06-06 DIAGNOSIS — R7303 Prediabetes: Secondary | ICD-10-CM | POA: Diagnosis not present

## 2024-06-06 LAB — HM PAP SMEAR: HPV, high-risk: NEGATIVE

## 2024-06-06 MED ORDER — VENLAFAXINE HCL ER 75 MG PO CP24: 75.0000 mg | ORAL_CAPSULE | Freq: Every day | ORAL | 3 refills | Status: AC

## 2024-06-06 MED ORDER — THYROID 60 MG PO TABS: 60.0000 mg | ORAL_TABLET | Freq: Every day | ORAL | 1 refills | Status: DC

## 2024-06-06 NOTE — Progress Notes (Signed)
 MyChart Video Visit    Virtual Visit via Video Note  H This patient is at least at moderate risk for complications without adequate follow up. This format is felt to be most appropriate for this patient at this time. Physical exam was limited by quality of the video and audio technology used for the visit. Gina Pineda was able to get the patient set up on a video visit.  Patient location: home  Patient and provider in visit Provider location: Office  I discussed the limitations of evaluation and management by telemedicine and the availability of in person appointments. The patient expressed understanding and agreed to proceed.  Visit Date: 06/06/2024  Today's healthcare provider: Jamee JONELLE Antonio Cyndee, DO     Subjective:    Patient ID: Gina Pineda, female    DOB: [birth year], 53 y.o.   MRN: [id number]  Chief Complaint  Patient presents with   Hypothyroidism    HPI Patient is in today for f/u thyroid .  Discussed the use of AI scribe software for clinical note transcription with the patient, who gave verbal consent to proceed.  History of Present Illness Gina Pineda is a 53 year old female with hypothyroidism and anxiety who presents for medication management and follow-up.  She recently consulted with a gynecologist regarding hormone issues and was satisfied with the consultation. She discussed her current use of an antidepressant and anti-anxiety medication, which has significantly alleviated her anxiety and reduced episodes of palpitations. She describes the medication as having a calming effect on her worries.  She is currently taking Effexor  75 mg, which she finds effective for her anxiety. However, she has noticed a recurrence of hot flashes. To address these symptoms, she has been prescribed an estradiol  patch at a dose of 0.025 mg and progesterone  100 mg daily.  For her hypothyroidism, she has been on Armour Thyroid  60 mg, which has been effective. However, she  has run out of her medication and is unable to see her previous provider for a refill. She is considering getting blood work done to assess her current thyroid  levels.  Following a previous visit, she experienced significant anxiety and palpitations, prompting her to seek care from a PA. Since starting the anti-anxiety medication, these symptoms have improved.    Past Medical History:  Diagnosis Date   Anxiety    Ovarian cyst 08/2015   Thyroid  disease    Hypothyroidism    Past Surgical History:  Procedure Laterality Date   BREAST BIOPSY Left    CESAREAN SECTION     INTRAUTERINE DEVICE INSERTION     NASAL SEPTUM SURGERY      Family History  Problem Relation Age of Onset   Hypertension Father    Hyperlipidemia Father    Breast cancer Maternal Grandmother    Cancer Maternal Grandmother 68       breast   Diabetes Maternal Grandfather    Cancer Maternal Aunt 26       breast   Breast cancer Maternal Aunt    Breast cancer Other     Social History   Socioeconomic History   Marital status: Married    Spouse name: Not on file   Number of children: 2   Years of education: Not on file   Highest education level: Associate degree: occupational, Scientist, product/process development, or vocational program  Occupational History   Occupation: massage therapist    Employer: [employer]  Tobacco Use   Smoking status: Never   Smokeless tobacco: Never  Vaping Use   Vaping status: Never Used  Substance and Sexual Activity   Alcohol use: Yes    Comment: 4-5 drinks/ month   Drug use: No   Sexual activity: Yes    Partners: Male  Other Topics Concern   Not on file  Social History Narrative   GETS REG EXERCISE         Social Drivers of Health   Financial Resource Strain: Low Risk  (06/05/2024)   Overall Financial Resource Strain (CARDIA)    Difficulty of Paying Living Expenses: Not very hard  Food Insecurity: No Food Insecurity (06/05/2024)   Hunger Vital Sign    Worried About Running Out of  Food in the Last Year: Never true    Ran Out of Food in the Last Year: Never true  Transportation Needs: No Transportation Needs (06/05/2024)   PRAPARE - Administrator, Civil Service (Medical): No    Lack of Transportation (Non-Medical): No  Physical Activity: Insufficiently Active (06/05/2024)   Exercise Vital Sign    Days of Exercise per Week: 1 day    Minutes of Exercise per Session: 130 min  Stress: Stress Concern Present (06/05/2024)   Gina Pineda [employer] - Occupational Stress Questionnaire    Feeling of Stress: To some extent  Social Connections: Moderately Isolated (06/05/2024)   Social Connection and Isolation Panel    Frequency of Communication with Friends and Family: More than three times a week    Frequency of Social Gatherings with Friends and Family: Once a week    Attends Religious Services: Patient declined    Database administrator or Organizations: No    Attends Engineer, structural: Not on file    Marital Status: Married  Intimate Partner Violence: Unknown (02/17/2022)   Received from [facility]   HITS    Physically Hurt: Not on file    Insult or Talk Down To: Not on file    Threaten Physical Harm: Not on file    Scream or Curse: Not on file    Outpatient Medications Prior to Visit  Medication Sig Dispense Refill   Blood Glucose Monitoring Suppl DEVI 1 each by Does not apply route in the morning, at noon, and at bedtime. May substitute to any manufacturer covered by patient's insurance. 1 each 0   cholecalciferol (VITAMIN D3) 25 MCG (1000 UT) tablet Take 1,000 Units by mouth daily.     estradiol  (CLIMARA  - DOSED IN MG/24 HR) 0.025 mg/24hr patch Place 1 patch (0.025 mg total) onto the skin once a week.     progesterone  (PROMETRIUM ) 100 MG capsule Take 1 capsule (100 mg total) by mouth daily.     SUMAtriptan  (IMITREX ) 50 MG tablet TAKE 1 TABLET BY MOUTH AS NEEDED FOR MIGRAINE. MAY REPEAT IN 2 HOURS IF NEEDED 9 tablet 2    ARMOUR THYROID  PO Take 65 mg by mouth.     venlafaxine  XR (EFFEXOR -XR) 75 MG 24 hr capsule TAKE 1 CAPSULE BY MOUTH DAILY WITH BREAKFAST. 90 capsule 0   ARMOUR THYROID  15 MG tablet TAKE ONE BY MOUTH ONCE DAILY WITH 60 MG (75 MG TOTAL), EMPTY STOMACH (Patient not taking: Reported on 06/06/2024) 90 tablet 1   No facility-administered medications prior to visit.    Allergies  Allergen Reactions   Potassium-Containing Compounds Swelling    Review of Systems  Constitutional:  Negative for fever and malaise/fatigue.  HENT:  Negative for congestion.   Eyes:  Negative for blurred vision.  Respiratory:  Negative for cough and shortness of breath.   Cardiovascular:  Negative for chest pain, palpitations and leg swelling.  Gastrointestinal:  Negative for vomiting.  Musculoskeletal:  Negative for back pain.  Skin:  Negative for rash.  Neurological:  Negative for loss of consciousness and headaches.       Objective:    Physical Exam Vitals and nursing note reviewed.  Constitutional:      General: She is not in acute distress.    Appearance: Normal appearance. She is well-developed.  Pulmonary:     Effort: Pulmonary effort is normal.  Neurological:     Mental Status: She is alert and oriented to person, place, and time.  Psychiatric:        Mood and Affect: Mood normal.        Behavior: Behavior normal.        Thought Content: Thought content normal.        Judgment: Judgment normal.     There were no vitals taken for this visit. Wt Readings from Last 3 Encounters:  12/06/23 165 lb 4 oz (75 kg)  12/05/23 165 lb 9.6 oz (75.1 kg)  09/23/21 147 lb (66.7 kg)       Assessment & Plan:  Prediabetes -     Comprehensive metabolic panel with GFR; Future -     Hemoglobin A1c; Future  Perimenopause -     CBC with Differential/Platelet; Future -     Comprehensive metabolic panel with GFR; Future -     Lipid panel; Future -     TSH; Future -     Hemoglobin A1c; Future  Vasomotor  flushing -     Venlafaxine  HCl ER; Take 1 capsule (75 mg total) by mouth daily with breakfast.  Dispense: 90 capsule; Refill: 3  Anxiety -     Venlafaxine  HCl ER; Take 1 capsule (75 mg total) by mouth daily with breakfast.  Dispense: 90 capsule; Refill: 3  Hypothyroidism, unspecified type -     Thyroid ; Take 1 tablet (60 mg total) by mouth daily before breakfast.  Dispense: 90 tablet; Refill: 1  Assessment and Plan Assessment & Plan Menopausal symptoms   She experiences hot flashes and anxiety due to menopause. Effexor  has significantly alleviated her anxiety but not the hot flashes. A gynecologist initiated hormone replacement therapy with a low-dose estradiol  patch and progesterone  to address the hot flashes. She prefers not to increase the Effexor  dose due to side effect concerns. Continue Effexor  75 mg daily for anxiety. Start estradiol  patch 0.025 mg and progesterone  100 mg daily as prescribed.  Hypothyroidism   Her hypothyroidism is managed with Armour Thyroid  60 mg. The medication is depleted, and she does not plan to see her previous provider for a refill. She is open to a thyroid  level assessment to guide management. Refill Armour Thyroid  60 mg to maintain current treatment until lab results are available. Order thyroid  function tests.    I discussed the assessment and treatment plan with the patient. The patient was provided an opportunity to ask questions and all were answered. The patient agreed with the plan and demonstrated an understanding of the instructions.   The patient was advised to call back or seek an in-person evaluation if the symptoms worsen or if the condition fails to improve as anticipated.  Gina JONELLE Antonio Cyndee, DO [facility] [phone] (phone) [phone] (fax)  [facility]

## 2024-06-18 ENCOUNTER — Telehealth: Payer: Self-pay | Admitting: Neurology

## 2024-06-18 NOTE — Telephone Encounter (Signed)
 Copied from CRM [id number]. Topic: Appointments - Scheduling Inquiry for Clinic >> Jun 18, 2024 12:30 PM Jasmin G wrote: Reason for CRM: Pt would like to schedule an appt to get lab work done, please call pt back at [phone] to schedule ASAP

## 2024-06-18 NOTE — Telephone Encounter (Signed)
 Labs were ordered.  Pt needs lab appt please

## 2024-06-19 NOTE — Telephone Encounter (Signed)
 Call pt and got her scheduled for next week.

## 2024-06-26 DIAGNOSIS — N951 Menopausal and female climacteric states: Secondary | ICD-10-CM | POA: Diagnosis not present

## 2024-06-27 ENCOUNTER — Other Ambulatory Visit (INDEPENDENT_AMBULATORY_CARE_PROVIDER_SITE_OTHER)

## 2024-06-27 DIAGNOSIS — R7303 Prediabetes: Secondary | ICD-10-CM | POA: Diagnosis not present

## 2024-06-27 DIAGNOSIS — N951 Menopausal and female climacteric states: Secondary | ICD-10-CM

## 2024-06-27 LAB — COMPREHENSIVE METABOLIC PANEL WITH GFR
ALT: 22 U/L (ref 0–35)
AST: 26 U/L (ref 0–37)
Albumin: 4.5 g/dL (ref 3.5–5.2)
Alkaline Phosphatase: 71 U/L (ref 39–117)
BUN: 13 mg/dL (ref 6–23)
CO2: 29 meq/L (ref 19–32)
Calcium: 9.2 mg/dL (ref 8.4–10.5)
Chloride: 100 meq/L (ref 96–112)
Creatinine, Ser: 0.75 mg/dL (ref 0.40–1.20)
GFR: 91.03 mL/min (ref >60.00)
Glucose, Bld: 97 mg/dL (ref 70–99)
Potassium: 3.9 meq/L (ref 3.5–5.1)
Sodium: 137 meq/L (ref 135–145)
Total Bilirubin: 0.7 mg/dL (ref 0.2–1.2)
Total Protein: 7.3 g/dL (ref 6.0–8.3)

## 2024-06-27 LAB — LIPID PANEL
Cholesterol: 214 mg/dL — ABNORMAL HIGH (ref 0–200)
HDL: 75.5 mg/dL (ref >39.00)
LDL Cholesterol: 124 mg/dL — ABNORMAL HIGH (ref 0–99)
NonHDL: 138.79
Total CHOL/HDL Ratio: 3
Triglycerides: 73 mg/dL (ref 0.0–149.0)
VLDL: 14.6 mg/dL (ref 0.0–40.0)

## 2024-06-27 LAB — CBC WITH DIFFERENTIAL/PLATELET
Basophils Absolute: 0 K/uL (ref 0.0–0.1)
Basophils Relative: 0.7 % (ref 0.0–3.0)
Eosinophils Absolute: 0.2 K/uL (ref 0.0–0.7)
Eosinophils Relative: 2.7 % (ref 0.0–5.0)
HCT: 46.3 % — ABNORMAL HIGH (ref 36.0–46.0)
Hemoglobin: 15.3 g/dL — ABNORMAL HIGH (ref 12.0–15.0)
Lymphocytes Relative: 25.5 % (ref 12.0–46.0)
Lymphs Abs: 1.5 K/uL (ref 0.7–4.0)
MCHC: 32.9 g/dL (ref 30.0–36.0)
MCV: 95.5 fl (ref 78.0–100.0)
Monocytes Absolute: 0.4 K/uL (ref 0.1–1.0)
Monocytes Relative: 7.3 % (ref 3.0–12.0)
Neutro Abs: 3.7 K/uL (ref 1.4–7.7)
Neutrophils Relative %: 63.8 % (ref 43.0–77.0)
Platelets: 241 K/uL (ref 150.0–400.0)
RBC: 4.85 Mil/uL (ref 3.87–5.11)
RDW: 13.8 % (ref 11.5–15.5)
WBC: 5.8 K/uL (ref 4.0–10.5)

## 2024-06-27 LAB — TSH: TSH: 2.06 u[IU]/mL (ref 0.35–5.50)

## 2024-06-27 LAB — HEMOGLOBIN A1C: Hgb A1c MFr Bld: 5.5 % (ref 4.6–6.5)

## 2024-07-02 ENCOUNTER — Ambulatory Visit: Payer: Self-pay | Admitting: Family Medicine

## 2024-07-11 ENCOUNTER — Encounter: Admitting: Family Medicine

## 2024-08-18 ENCOUNTER — Other Ambulatory Visit: Payer: Self-pay | Admitting: Family Medicine

## 2024-08-18 DIAGNOSIS — R519 Headache, unspecified: Secondary | ICD-10-CM

## 2024-11-26 ENCOUNTER — Other Ambulatory Visit: Payer: Self-pay | Admitting: Family Medicine

## 2024-11-26 DIAGNOSIS — R519 Headache, unspecified: Secondary | ICD-10-CM

## 2024-11-27 ENCOUNTER — Other Ambulatory Visit: Payer: Self-pay | Admitting: Family Medicine

## 2024-11-27 DIAGNOSIS — E039 Hypothyroidism, unspecified: Secondary | ICD-10-CM

## 2024-11-27 NOTE — Telephone Encounter (Signed)
 Lvm to sched

## 2024-12-31 ENCOUNTER — Ambulatory Visit: Admitting: Family Medicine
